# Patient Record
Sex: Female | Born: 1975 | Race: Black or African American | Hispanic: No | Marital: Married | Smoking: Never smoker
Health system: Southern US, Community
[De-identification: ages and names within clinical notes are randomized; demographics above are authoritative.]

## PROBLEM LIST (undated history)

## (undated) DIAGNOSIS — E785 Hyperlipidemia, unspecified: Secondary | ICD-10-CM

## (undated) DIAGNOSIS — D649 Anemia, unspecified: Secondary | ICD-10-CM

## (undated) DIAGNOSIS — I639 Cerebral infarction, unspecified: Secondary | ICD-10-CM

## (undated) DIAGNOSIS — I10 Essential (primary) hypertension: Secondary | ICD-10-CM

## (undated) DIAGNOSIS — R011 Cardiac murmur, unspecified: Secondary | ICD-10-CM

## (undated) DIAGNOSIS — E669 Obesity, unspecified: Secondary | ICD-10-CM

## (undated) DIAGNOSIS — K219 Gastro-esophageal reflux disease without esophagitis: Secondary | ICD-10-CM

## (undated) DIAGNOSIS — E079 Disorder of thyroid, unspecified: Secondary | ICD-10-CM

## (undated) DIAGNOSIS — E039 Hypothyroidism, unspecified: Secondary | ICD-10-CM

## (undated) DIAGNOSIS — R739 Hyperglycemia, unspecified: Secondary | ICD-10-CM

## (undated) DIAGNOSIS — N200 Calculus of kidney: Secondary | ICD-10-CM

## (undated) HISTORY — DX: Hyperlipidemia, unspecified: E78.5

## (undated) HISTORY — DX: Hyperglycemia, unspecified: R73.9

## (undated) HISTORY — PX: SLEEVE GASTROPLASTY: SHX1101

## (undated) HISTORY — DX: Cerebral infarction, unspecified: I63.9

## (undated) HISTORY — PX: PARTIAL HYSTERECTOMY: SHX80

## (undated) HISTORY — DX: Disorder of thyroid, unspecified: E07.9

## (undated) HISTORY — DX: Anemia, unspecified: D64.9

## (undated) HISTORY — DX: Obesity, unspecified: E66.9

## (undated) HISTORY — DX: Calculus of kidney: N20.0

## (undated) HISTORY — DX: Hypothyroidism, unspecified: E03.9

## (undated) HISTORY — DX: Essential (primary) hypertension: I10

## (undated) HISTORY — DX: Cardiac murmur, unspecified: R01.1

## (undated) HISTORY — PX: COLONOSCOPY: SHX174

## (undated) HISTORY — DX: Gastro-esophageal reflux disease without esophagitis: K21.9

---

## 1999-09-27 ENCOUNTER — Emergency Department (HOSPITAL_COMMUNITY): Admission: EM | Admit: 1999-09-27 | Discharge: 1999-09-27 | Payer: Self-pay | Admitting: Emergency Medicine

## 2003-08-04 ENCOUNTER — Emergency Department (HOSPITAL_COMMUNITY): Admission: EM | Admit: 2003-08-04 | Discharge: 2003-08-04 | Payer: Self-pay | Admitting: Emergency Medicine

## 2004-11-10 ENCOUNTER — Emergency Department: Payer: Self-pay | Admitting: Emergency Medicine

## 2005-01-15 ENCOUNTER — Emergency Department: Payer: Self-pay | Admitting: Emergency Medicine

## 2005-07-06 ENCOUNTER — Ambulatory Visit: Payer: Self-pay | Admitting: Unknown Physician Specialty

## 2005-07-08 ENCOUNTER — Observation Stay: Payer: Self-pay | Admitting: Unknown Physician Specialty

## 2005-08-15 ENCOUNTER — Other Ambulatory Visit: Payer: Self-pay

## 2005-08-15 ENCOUNTER — Emergency Department: Payer: Self-pay | Admitting: Emergency Medicine

## 2006-01-08 ENCOUNTER — Emergency Department: Payer: Self-pay | Admitting: Emergency Medicine

## 2006-01-08 ENCOUNTER — Other Ambulatory Visit: Payer: Self-pay

## 2006-09-29 ENCOUNTER — Emergency Department: Payer: Self-pay | Admitting: Emergency Medicine

## 2007-01-15 ENCOUNTER — Emergency Department (HOSPITAL_COMMUNITY): Admission: EM | Admit: 2007-01-15 | Discharge: 2007-01-15 | Payer: Self-pay | Admitting: Emergency Medicine

## 2007-09-30 ENCOUNTER — Emergency Department: Payer: Self-pay | Admitting: Emergency Medicine

## 2007-09-30 ENCOUNTER — Other Ambulatory Visit: Payer: Self-pay

## 2008-02-13 ENCOUNTER — Emergency Department: Payer: Self-pay | Admitting: Emergency Medicine

## 2008-10-01 ENCOUNTER — Emergency Department: Payer: Self-pay | Admitting: Emergency Medicine

## 2008-12-02 ENCOUNTER — Emergency Department: Payer: Self-pay | Admitting: Emergency Medicine

## 2009-11-22 ENCOUNTER — Emergency Department: Payer: Self-pay

## 2011-07-28 ENCOUNTER — Emergency Department: Payer: Self-pay | Admitting: Emergency Medicine

## 2011-07-28 LAB — CBC
HCT: 38.6 % (ref 35.0–47.0)
MCHC: 33 g/dL (ref 32.0–36.0)
MCV: 82 fL (ref 80–100)
Platelet: 240 10*3/uL (ref 150–440)
RDW: 13.6 % (ref 11.5–14.5)

## 2011-07-28 LAB — COMPREHENSIVE METABOLIC PANEL
Albumin: 3.8 g/dL (ref 3.4–5.0)
Alkaline Phosphatase: 55 U/L (ref 50–136)
BUN: 8 mg/dL (ref 7–18)
Bilirubin,Total: 0.5 mg/dL (ref 0.2–1.0)
Chloride: 106 mmol/L (ref 98–107)
Creatinine: 0.94 mg/dL (ref 0.60–1.30)
Potassium: 4 mmol/L (ref 3.5–5.1)
SGOT(AST): 20 U/L (ref 15–37)
SGPT (ALT): 35 U/L
Total Protein: 7.4 g/dL (ref 6.4–8.2)

## 2012-05-17 ENCOUNTER — Observation Stay: Payer: Self-pay | Admitting: Family Medicine

## 2012-05-17 LAB — HEMOGLOBIN A1C: Hemoglobin A1C: 5.9 % (ref 4.2–6.3)

## 2012-05-17 LAB — BASIC METABOLIC PANEL
Anion Gap: 4 — ABNORMAL LOW (ref 7–16)
BUN: 8 mg/dL (ref 7–18)
Chloride: 107 mmol/L (ref 98–107)
Creatinine: 1.01 mg/dL (ref 0.60–1.30)
EGFR (Non-African Amer.): >60
Potassium: 4.4 mmol/L (ref 3.5–5.1)

## 2012-05-17 LAB — CBC
MCH: 26.5 pg (ref 26.0–34.0)
MCV: 81 fL (ref 80–100)
Platelet: 221 10*3/uL (ref 150–440)
RBC: 5.03 10*6/uL (ref 3.80–5.20)

## 2012-05-17 LAB — PROTIME-INR: INR: 0.9

## 2012-05-17 LAB — TROPONIN I
Troponin-I: <0.02 ng/mL
Troponin-I: <0.02 ng/mL

## 2012-05-17 LAB — LIPID PANEL: HDL Cholesterol: 45 mg/dL (ref 40–60)

## 2012-05-17 LAB — TSH: Thyroid Stimulating Horm: 0.54 u[IU]/mL

## 2012-05-17 LAB — CK TOTAL AND CKMB (NOT AT ARMC): CK, Total: 98 U/L (ref 21–215)

## 2012-05-18 LAB — BASIC METABOLIC PANEL
Anion Gap: 6 — ABNORMAL LOW (ref 7–16)
BUN: 12 mg/dL (ref 7–18)
Calcium, Total: 8.3 mg/dL — ABNORMAL LOW (ref 8.5–10.1)
Chloride: 104 mmol/L (ref 98–107)
Co2: 28 mmol/L (ref 21–32)
Osmolality: 276 (ref 275–301)
Potassium: 4.5 mmol/L (ref 3.5–5.1)

## 2012-05-18 LAB — CBC WITH DIFFERENTIAL/PLATELET
Basophil #: 0 10*3/uL (ref 0.0–0.1)
Lymphocyte #: 2.3 10*3/uL (ref 1.0–3.6)
MCH: 26.6 pg (ref 26.0–34.0)
MCHC: 32.5 g/dL (ref 32.0–36.0)
MCV: 82 fL (ref 80–100)
Monocyte #: 0.9 x10 3/mm (ref 0.2–0.9)
Platelet: 201 10*3/uL (ref 150–440)
RDW: 13.8 % (ref 11.5–14.5)

## 2012-10-23 ENCOUNTER — Ambulatory Visit: Payer: Self-pay | Admitting: Internal Medicine

## 2013-05-21 ENCOUNTER — Emergency Department: Payer: Self-pay | Admitting: Emergency Medicine

## 2014-01-30 ENCOUNTER — Ambulatory Visit: Payer: Self-pay | Admitting: Surgery

## 2014-02-06 ENCOUNTER — Ambulatory Visit: Payer: Self-pay | Admitting: Surgery

## 2014-02-06 LAB — CBC WITH DIFFERENTIAL/PLATELET
BASOS ABS: 0 10*3/uL (ref 0.0–0.1)
Basophil %: 0.5 %
EOS ABS: 0.2 10*3/uL (ref 0.0–0.7)
EOS PCT: 2.3 %
HCT: 40.1 % (ref 35.0–47.0)
HGB: 12.9 g/dL (ref 12.0–16.0)
LYMPHS ABS: 2.3 10*3/uL (ref 1.0–3.6)
Lymphocyte %: 27.5 %
MCH: 26.6 pg (ref 26.0–34.0)
MCHC: 32.1 g/dL (ref 32.0–36.0)
MCV: 83 fL (ref 80–100)
MONOS PCT: 9.4 %
Monocyte #: 0.8 x10 3/mm (ref 0.2–0.9)
NEUTROS ABS: 4.9 10*3/uL (ref 1.4–6.5)
Neutrophil %: 60.3 %
Platelet: 216 10*3/uL (ref 150–440)
RBC: 4.83 10*6/uL (ref 3.80–5.20)
RDW: 13.8 % (ref 11.5–14.5)
WBC: 8.2 10*3/uL (ref 3.6–11.0)

## 2014-02-06 LAB — PHOSPHORUS: Phosphorus: 3.3 mg/dL (ref 2.5–4.9)

## 2014-02-06 LAB — COMPREHENSIVE METABOLIC PANEL
ALK PHOS: 57 U/L
ALT: 36 U/L
ANION GAP: 10 (ref 7–16)
Albumin: 3.7 g/dL (ref 3.4–5.0)
BUN: 8 mg/dL (ref 7–18)
Bilirubin,Total: 0.5 mg/dL (ref 0.2–1.0)
CALCIUM: 8.4 mg/dL — AB (ref 8.5–10.1)
CO2: 23 mmol/L (ref 21–32)
Chloride: 106 mmol/L (ref 98–107)
Creatinine: 1.04 mg/dL (ref 0.60–1.30)
EGFR (African American): >60
Glucose: 106 mg/dL — ABNORMAL HIGH (ref 65–99)
Osmolality: 276 (ref 275–301)
Potassium: 3.8 mmol/L (ref 3.5–5.1)
SGOT(AST): 17 U/L (ref 15–37)
Sodium: 139 mmol/L (ref 136–145)
Total Protein: 7 g/dL (ref 6.4–8.2)

## 2014-02-06 LAB — IRON AND TIBC
Iron Bind.Cap.(Total): 353 ug/dL (ref 250–450)
Iron Saturation: 13 %
Iron: 45 ug/dL — ABNORMAL LOW (ref 50–170)
Unbound Iron-Bind.Cap.: 308 ug/dL

## 2014-02-06 LAB — FOLATE: Folic Acid: 17.1 ng/mL (ref 3.1–100.0)

## 2014-02-06 LAB — MAGNESIUM: MAGNESIUM: 2 mg/dL

## 2014-02-06 LAB — PROTIME-INR
INR: 0.9
PROTHROMBIN TIME: 11.9 s (ref 11.5–14.7)

## 2014-02-06 LAB — APTT: Activated PTT: 33.2 secs (ref 23.6–35.9)

## 2014-02-06 LAB — TSH: Thyroid Stimulating Horm: 0.599 u[IU]/mL

## 2014-02-06 LAB — AMYLASE: Amylase: 29 U/L (ref 25–115)

## 2014-02-06 LAB — FERRITIN: Ferritin (ARMC): 26 ng/mL (ref 8–388)

## 2014-02-06 LAB — HEMOGLOBIN A1C: Hemoglobin A1C: 5.7 % (ref 4.2–6.3)

## 2014-02-10 ENCOUNTER — Encounter (INDEPENDENT_AMBULATORY_CARE_PROVIDER_SITE_OTHER): Payer: Self-pay

## 2014-02-10 ENCOUNTER — Encounter: Payer: Self-pay | Admitting: Cardiovascular Disease

## 2014-02-10 ENCOUNTER — Ambulatory Visit (INDEPENDENT_AMBULATORY_CARE_PROVIDER_SITE_OTHER): Payer: BC Managed Care – PPO | Admitting: Cardiovascular Disease

## 2014-02-10 VITALS — BP 122/90 | HR 69 | Ht 65.0 in | Wt 255.2 lb

## 2014-02-10 DIAGNOSIS — R0789 Other chest pain: Secondary | ICD-10-CM | POA: Insufficient documentation

## 2014-02-10 DIAGNOSIS — Z01810 Encounter for preprocedural cardiovascular examination: Secondary | ICD-10-CM

## 2014-02-10 NOTE — Progress Notes (Signed)
     [name] Date of Birth  1976/01/28       [facility]    [employer] [address], [address], [place], [place]  [address]   [place], [place]  [address] [place]   Fax  [phone]     Fax [phone]  Problem List: 1. Atypical chest pain - has had 2 negative stress tests.  2. Morbid obesity   History of Present Illness:  Diana is a 38 yo with hx of morbid obesity .  She is here for pre-op evaluation prior to having a gastric sleeve procedure.    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    She's had 2 separate stress test both of which are negative. Her for a stress test was in 2013. She had normal left ventricle systolic function with an ejection fraction of 67%. There was no evidence of ischemia. She also had a stress test with Dr. Rosario Jacks.  Which was reportedly normal.   I do not have those results.      No current outpatient prescriptions on file prior to visit.   No current facility-administered medications on file prior to visit.    No Known Allergies  Past Medical History  Diagnosis Date  . Hyperlipidemia   . Hypertension   . Hyperglycemia   . Hypothyroidism   . Thyroid mass   . Obesity     Past Surgical History  Procedure Laterality Date  . Partial hysterectomy    . Cesarean section      History  Smoking status  . Never Smoker   Smokeless tobacco  . Not on file    History  Alcohol Use  . Yes    Comment: occas wine.    Family History  Problem Relation Age of Onset  . Hypertension Mother   . Hyperlipidemia Mother   . Hypertension Father   . Hyperlipidemia Father   . Heart attack Paternal Aunt 61  . Heart attack Paternal Aunt 64    Reviw of Systems:  Reviewed in the HPI.  All other systems are  negative.  Physical Exam: Blood pressure 122/90, pulse 69, height 5\' 5"  (1.651 m), weight 255 lb 4 oz (115.781 kg). Wt Readings from Last 3 Encounters:  02/10/14 255 lb 4 oz (115.781 kg)    General: Well developed, well nourished, in no acute distress.  Head: Normocephalic, atraumatic, sclera non-icteric, mucus membranes are moist,   Neck: Supple. Carotids are 2 + without bruits. No JVD   Lungs: Clear   Heart: RR, normal S1S2, no murmur  Abdomen: Soft, non-tender, non-distended with normal bowel sounds.  Msk:  Strength and tone are normal   Extremities: No clubbing or cyanosis. No edema.  Distal pedal pulses are 2+ and equal    Neuro: CN II - XII intact.  Alert and oriented X 3.   Psych:  Normal   ECG: Sept. 1, 2015: Normal sinus rhythm at 69. She has no ST or T wave changes.  Assessment / Plan:

## 2014-02-10 NOTE — Assessment & Plan Note (Addendum)
[place]  presents for further evaluation of some atypical chest pain. She's had chest pains for the past several years and has had 2 separate nuclear stress test which up in negative. She now scheduled to have bariatric surgery and these have cardiac clearance.  The patient has some atypical left arm discomfort does not sound anginal. She's able to participate in fairly rigorous activities for an hour at a time and does not having episodes of chest pain or shortness breath.  Her EKG is normal.  She had a normal stress test approximately 6 months ago. In my opinion I think she is at low risk for upcoming gastric sleeve procedure. I will see her on an as needed basis.    Copy of report to  [facility] [address] [place], [place]  [address] [email or web address] Fax:  [phone]

## 2014-02-10 NOTE — Patient Instructions (Signed)
Call or return to clinic prn if these symptoms worsen or fail to improve as anticipated.

## 2014-04-07 ENCOUNTER — Emergency Department: Payer: Self-pay | Admitting: Emergency Medicine

## 2014-04-07 LAB — CBC
HCT: 41.8 % (ref 35.0–47.0)
HGB: 13 g/dL (ref 12.0–16.0)
MCH: 25.9 pg — ABNORMAL LOW (ref 26.0–34.0)
MCHC: 31.2 g/dL — AB (ref 32.0–36.0)
MCV: 83 fL (ref 80–100)
PLATELETS: 182 10*3/uL (ref 150–440)
RBC: 5.03 10*6/uL (ref 3.80–5.20)
RDW: 14.5 % (ref 11.5–14.5)
WBC: 5.7 10*3/uL (ref 3.6–11.0)

## 2014-04-07 LAB — D-DIMER(ARMC): D-DIMER: 231 ng/mL

## 2014-04-07 LAB — BASIC METABOLIC PANEL
Anion Gap: 8 (ref 7–16)
BUN: 9 mg/dL (ref 7–18)
CALCIUM: 8.7 mg/dL (ref 8.5–10.1)
Chloride: 105 mmol/L (ref 98–107)
Co2: 28 mmol/L (ref 21–32)
Creatinine: 0.99 mg/dL (ref 0.60–1.30)
EGFR (Non-African Amer.): >60
Glucose: 97 mg/dL (ref 65–99)
OSMOLALITY: 280 (ref 275–301)
Potassium: 3.4 mmol/L — ABNORMAL LOW (ref 3.5–5.1)
Sodium: 141 mmol/L (ref 136–145)

## 2014-04-07 LAB — TROPONIN I

## 2014-09-29 NOTE — Discharge Summary (Signed)
PATIENT NAME:  [name], Diana D MR#:  [id number] DATE OF BIRTH:  16-Nov-1975  DATE OF ADMISSION:  05/17/2012 DATE OF DISCHARGE:  05/18/2012  REASON FOR ADMISSION:  Chest pain.  DISCHARGE DIAGNOSES:  1. Pleuritic chest pain.  2. Hyperlipidemia.  3. Hypertension.  4. Hyperglycemia with normal hemoglobin A1c.  5. Hypothyroidism.  6. History of thyroid mass.  7. Obesity.  8. Possible obstructive sleep apnea.   IMPORTANT TESTS:  Full stress test results to follow. Negative, but no full report at this moment.   LABORATORY DATA: Within normal limits with a glucose of 113, normal creatinine and normal electrolytes. Troponins are negative times three. TSH 0.5, hemoglobin 12, white count 7.9. Chest x-ray within normal limits.  Coagulation within normal limits. Normal sinus rhythm on EKG.   HOSPITAL COURSE: [name] is a very nice 39 year old female who has history of hypertension as mentioned above, hyperlipidemia, and borderline diabetes. Apparently she has a history of two weeks of having occasional chest pain for which she has seen her primary care physician.  Her primary care physician has ordered a stress test that she had scheduled for 12/18. Unfortunately, she had significant chest pain the morning of admission on 05/17/2012 at 8:30 at work, which she was concerned was indigestion. The pain got worse. The patient started sweating, having shortness of breath, and significant anxiety and mild agitation for which she drove to the ER, but she had to stop in the middle of the road with worsening of the pain.   On evaluation in the ER the patient did not have any ST depression or elevation. No changes of her oxygen saturation. No significant shortness of breath. She was admitted for observation and a stress test was done. Cardiac enzymes were negative.  The patient did very well. She had occasional GI upset for which I recommended she follow up with her primary care physician to get an EGD. At  this moment I am not going to prescribe a PPI so that we do not mask any of the symptoms.    Her blood pressure was well controlled and I recommended to continue taking lisinopril.   Thyroid disease- the patient has hypothyroidism and takes thyroid replacement. Her TSH is normal low. She had an ultrasound that apparently had a nodule and she will follow up with her primary care physician for this as well. Her borderline diabetes is fine. Her hemoglobin A1c is less than 6.  Hyperlipidemia- her lipid profile looks really good. She does have symptoms of obstructive sleep apnea for which I also recommended follow-up with an outpatient sleep study with her primary care physician.   TIME SPENT: I spent about 40 minutes with this discharge today.   ____________________________ [place] Sink, MD rsg:bjt D: 05/18/2012 15:46:26 ET T: 05/19/2012 11:56:03 ET JOB#: [id number]  cc: Glenwood Sink, MD, <Dictator> Donasia Wimes America Brown MD ELECTRONICALLY SIGNED 05/26/2012 14:05

## 2014-09-29 NOTE — H&P (Signed)
PATIENT NAME:  RICHMOND, [place] D MR#:  [id number] DATE OF BIRTH:  [birth year]  DATE OF ADMISSION:  05/17/2012  REASON FOR ADMISSION: Acute chest pain.   PRIMARY CARE PHYSICIAN: Elisabeth Pigeon, MD   REFERRING PHYSICIAN: Coralyn Pear, MD   HISTORY OF PRESENT ILLNESS: Ms. [name] is a very nice 39 year old female who has history of hyperlipidemia, hypertension, borderline diabetes, hypothyroidism with a thyroid mass who is obese and had a history of chest pain that started two weeks ago. The chest pain apparently comes and goes, is not everyday but when it comes it feels like a twist of some structure inside her chest and it lasts occasionally for minutes, occasionally for 30 minutes and then it goes away. It is not related to foods. It is not related to activity. It comes and goes at its own pace. Apparently this morning around 8:30 in the morning she went to work. At that moment she was already feeling some type of pain on the right side of the chest for what she was concerned about being indigestion. She went to work and the pain started getting worse to a certain point. She started having severe increase of the pain. It felt like a twitch inside and then something was stabbing her chest. She had a lot of pressure and she started getting really short of breath. Her right arm became numb and she started having intense sweating. After a while it started to calm down and she decided to go to the ER. Once she was in the car, she started having the same symptoms again and she had to stop and call EMS. The patient was brought to the ER. In the ER, she was evaluated and cardiac work-up so far is negative but due to her significant risk factors and intensity of the pain she is going to be admitted for observation and get a stress test in the morning. She already had a stress test scheduled for December 18th due to the progressive symptoms of chest pain.   REVIEW OF SYSTEMS: Denies any fever, fatigue, weakness,  weight loss or significant weight gain. EYES: Denies any blurry vision, glaucoma, cataracts. ENT: Denies any tinnitus, nasal discharge, postnasal drip. RESPIRATORY: No cough. No wheezing, no hemoptysis. CARDIOVASCULAR: Positive chest pain as mentioned above. No orthopnea, no edema, no arrhythmias, no palpitations. GI: No nausea, vomiting, diarrhea. No rectal bleeding. No constipation. GU: No dysuria or hematuria. GYN: No breast masses. No vaginal bleeding. ENDOCRINE: No polyuria, polydipsia, or polyphagia. Her glucose has been mildly elevated. She has hypothyroidism. No significant symptoms at this moment although she has a thyroid mass that has been evaluated by her primary care physician and she will have some work-up after her pain goes away. SKIN: No significant rashes or mole changes. HEME/LYMPH: No anemia. No easy bruising or bleeding or swollen glands. NEUROLOGICAL: No numbness, tingling. No TIA or CVA. PSYCHIATRIC: No anxiety, insomnia. The patient denies any depression.   PAST MEDICAL HISTORY:  1. Hyperlipidemia.  2. Hypertension.  3. Hyperglycemia. 4. Borderline diabetes.  5. Hypothyroidism.  6. Thyroid mass.  7. Obesity.  8. Possible obstructive sleep apnea, undiagnosed.   ALLERGIES: No known drug allergies.   PAST SURGICAL HISTORY:  1. Two Cesarean sections. 2. Partial hysterectomy.   FAMILY HISTORY: Multiple family members with sudden death due to coronary artery disease, heart attacks at the age of 89's and 84's. Two aunts recently died from sudden MI at the age of 35 and her uncle also had a massive  heart attack at the age of 30.   MEDICATIONS:  1. Crestor 5 mg once a day. 2. Lisinopril 10 mg once a day. 3. Synthroid 50 mcg once daily.   SOCIAL HISTORY: She does not smoke. She drinks occasionally wine twice a month. No IV drug abuse. She lives with her son and daughter. She is a single mother but she has a boyfriend. She works as a Librarian, academic at a [employer] and she states  that her job is not stressful at all.   PHYSICAL EXAMINATION:   VITAL SIGNS: Blood pressure of 132/88, pulse 72, respiratory rate 18, temperature 98.3.   GENERAL: The patient is alert, oriented x3, in no acute distress. No respiratory distress. Hemodynamically stable.   HEENT: Her pupils are equal and reactive. Extraocular movements are intact. Mucosa moist.   NECK: Supple. No JVD. No thyromegaly. No adenopathy. No carotid bruits. Normal range of motion.   CARDIOVASCULAR: Regular rate and rhythm. No murmurs, rubs, or gallops are appreciated at this moment. No displacement of PMI. Chest wall is mildly tender to palpation at the right side of the chest on top of the middle upper quadrant of the breast but the pain seems to be different than the one that the patient was having earlier.   LUNGS: Clear without any wheezing or crepitus. No use of accessory muscles. No dullness to percussion.   ABDOMEN: Soft, nontender, not distended. No hepatosplenomegaly. No masses. Bowel sounds are positive.   EXTREMITIES: No edema, no cyanosis, no clubbing. No Homans. Pulses +2. Capillary refill less than 3.   NEUROLOGIC: Cranial nerves II through XII are intact.   PSYCH: Mood is normal without any signs of acute anxiety or major depression.   LYMPHATIC: Negative for lymphadenopathy in neck or supraclavicular area.   SKIN: No rashes or petechiae.   MUSCULOSKELETAL: No significant joint abnormalities. No erythema on top of the joints.   LABORATORY, DIAGNOSTIC, AND RADIOLOGICAL DATA: Glucose 108. Electrolytes within normal limits with a creatinine of 1.01. Troponin is negative. White blood count 7.5, hemoglobin 13.3. INR 0.9.   EKG no ST elevation, normal sinus rhythm.   ASSESSMENT AND PLAN: This is a very nice 39 year old female who has history of hyperlipidemia, hypertension, borderline diabetes, obesity, hypothyroidism, and a recently discovered thyroid mass who comes here for chest pain.  1. Chest  pain, atypical, but the patient is with multiple risk factors. She does have atypical and typical features. Most of the episode today was very severe and it seems very typical for coronary artery disease except for the location of the pain, located a little bit to the right parasternal area. She has factors of family history, high cholesterol, high blood sugar, and high blood pressure. At this moment we are going to admit her on observation. Do cardiac enzymes and get a Myoview scan in the morning. I don't think she needs an echo at this moment but will consider getting one done depending on the results of the Myoview. The patient is going to be put on aspirin 325 mg a day and since she is ambulatory I think she can just use SCDs. I don't think she needs to be fully anticoagulated. I think the patient exhibits a lot of symptoms that might be related to gastrointestinal disease for which she might benefit from starting a better diet and starting a PPI. She might benefit from getting an outpatient GI appointment for possible EGD.  2. Hypertension, well controlled. Continue treatment with lisinopril. Her heart rate  is in the 60's for what I am not going to add on a beta-blocker.  3. Thyroid disease, hypothyroidism. Check TSH. The patient also has a history of thyroid mass which is being evaluated by her primary care physician. She already had an ultrasound that apparently was okay. I do not have the results and by okay I don't know it it's a nodule or something else. I do not feel a mass when I palpate her thyroid today. Will let her follow-up with her primary care physician for this issue.  4. Borderline diabetes. Add on a hemoglobin A1c today. The patient is not on medication.  5. Hyperlipidemia. Continue treatment with Crestor.  6. Possible obstructive sleep apnea. The patient is obese. She says that she snores a lot and occasionally has apneic episode and wakes up in the middle of the night on multiple  occasions. I will recommend her to do an outpatient sleep study.  7. DVT prophylaxis. SCDs and ambulation.  8. GI prophylaxis. PPI.   TIME SPENT: I spent about 40 minutes with this admission.   ____________________________ [place] Sink, MD rsg:drc D: 05/17/2012 13:14:02 ET T: 05/17/2012 14:19:04 ET JOB#: [id number]  cc: [place] Sink, MD, <Dictator> Tien Aispuro America Brown MD ELECTRONICALLY SIGNED 05/21/2012 7:25

## 2014-12-10 ENCOUNTER — Other Ambulatory Visit: Payer: Self-pay

## 2014-12-10 ENCOUNTER — Emergency Department
Admission: EM | Admit: 2014-12-10 | Discharge: 2014-12-11 | Discharge status: Against Medical Advice | Payer: Commercial Managed Care - HMO | Source: Home / Self Care | Attending: Emergency Medicine | Admitting: Emergency Medicine

## 2014-12-10 DIAGNOSIS — R739 Hyperglycemia, unspecified: Secondary | ICD-10-CM | POA: Diagnosis present

## 2014-12-10 DIAGNOSIS — R0789 Other chest pain: Secondary | ICD-10-CM | POA: Insufficient documentation

## 2014-12-10 DIAGNOSIS — E785 Hyperlipidemia, unspecified: Secondary | ICD-10-CM | POA: Diagnosis present

## 2014-12-10 DIAGNOSIS — Z6825 Body mass index (BMI) 25.0-25.9, adult: Secondary | ICD-10-CM

## 2014-12-10 DIAGNOSIS — K8050 Calculus of bile duct without cholangitis or cholecystitis without obstruction: Secondary | ICD-10-CM | POA: Diagnosis not present

## 2014-12-10 DIAGNOSIS — E669 Obesity, unspecified: Secondary | ICD-10-CM | POA: Diagnosis present

## 2014-12-10 DIAGNOSIS — N390 Urinary tract infection, site not specified: Secondary | ICD-10-CM | POA: Diagnosis present

## 2014-12-10 DIAGNOSIS — I10 Essential (primary) hypertension: Secondary | ICD-10-CM

## 2014-12-10 DIAGNOSIS — K8001 Calculus of gallbladder with acute cholecystitis with obstruction: Secondary | ICD-10-CM | POA: Diagnosis present

## 2014-12-10 DIAGNOSIS — Z9884 Bariatric surgery status: Secondary | ICD-10-CM

## 2014-12-10 DIAGNOSIS — E039 Hypothyroidism, unspecified: Secondary | ICD-10-CM | POA: Diagnosis present

## 2014-12-10 DIAGNOSIS — E049 Nontoxic goiter, unspecified: Secondary | ICD-10-CM | POA: Diagnosis present

## 2014-12-10 MED ORDER — GI COCKTAIL ~~LOC~~
30.0000 mL | Freq: Once | ORAL | Status: AC
  Administered 2014-12-10: 30 mL via ORAL

## 2014-12-10 NOTE — ED Notes (Signed)
Pt in with co chest pain tonight, no cardiac history.  Had gastric sleeve last year.

## 2014-12-11 ENCOUNTER — Emergency Department: Payer: Commercial Managed Care - HMO

## 2014-12-11 ENCOUNTER — Other Ambulatory Visit: Payer: Self-pay

## 2014-12-11 ENCOUNTER — Encounter: Payer: Self-pay | Admitting: Emergency Medicine

## 2014-12-11 ENCOUNTER — Inpatient Hospital Stay
Admission: EM | Admit: 2014-12-11 | Discharge: 2014-12-12 | DRG: 445 | Payer: Commercial Managed Care - HMO | Attending: Internal Medicine | Admitting: Internal Medicine

## 2014-12-11 DIAGNOSIS — K8050 Calculus of bile duct without cholangitis or cholecystitis without obstruction: Secondary | ICD-10-CM | POA: Diagnosis present

## 2014-12-11 DIAGNOSIS — K8001 Calculus of gallbladder with acute cholecystitis with obstruction: Secondary | ICD-10-CM

## 2014-12-11 DIAGNOSIS — R1011 Right upper quadrant pain: Secondary | ICD-10-CM

## 2014-12-11 LAB — CBC
HEMATOCRIT: 37.3 % (ref 35.0–47.0)
HEMOGLOBIN: 11.8 g/dL — AB (ref 12.0–16.0)
MCH: 26.3 pg (ref 26.0–34.0)
MCHC: 31.8 g/dL — ABNORMAL LOW (ref 32.0–36.0)
MCV: 82.8 fL (ref 80.0–100.0)
PLATELETS: 229 10*3/uL (ref 150–440)
RBC: 4.5 MIL/uL (ref 3.80–5.20)
RDW: 13.1 % (ref 11.5–14.5)
WBC: 11.5 10*3/uL — AB (ref 3.6–11.0)

## 2014-12-11 LAB — URINALYSIS COMPLETE WITH MICROSCOPIC (ARMC ONLY)
Glucose, UA: NEGATIVE mg/dL
Hgb urine dipstick: NEGATIVE
NITRITE: POSITIVE — AB
PROTEIN: 30 mg/dL — AB
Specific Gravity, Urine: 1.023 (ref 1.005–1.030)
pH: 5 (ref 5.0–8.0)

## 2014-12-11 LAB — COMPREHENSIVE METABOLIC PANEL
ALBUMIN: 3.9 g/dL (ref 3.5–5.0)
ALK PHOS: 46 U/L (ref 38–126)
ALK PHOS: 89 U/L (ref 38–126)
ALT: 59 U/L — AB (ref 14–54)
ALT: 518 U/L — ABNORMAL HIGH (ref 14–54)
ANION GAP: 9 (ref 5–15)
AST: 102 U/L — ABNORMAL HIGH (ref 15–41)
AST: 489 U/L — ABNORMAL HIGH (ref 15–41)
Albumin: 3.8 g/dL (ref 3.5–5.0)
Anion gap: 8 (ref 5–15)
BUN: 11 mg/dL (ref 6–20)
BUN: 7 mg/dL (ref 6–20)
CO2: 26 mmol/L (ref 22–32)
CO2: 26 mmol/L (ref 22–32)
Calcium: 8.8 mg/dL — ABNORMAL LOW (ref 8.9–10.3)
Calcium: 9 mg/dL (ref 8.9–10.3)
Chloride: 107 mmol/L (ref 101–111)
Chloride: 107 mmol/L (ref 101–111)
Creatinine, Ser: 0.88 mg/dL (ref 0.44–1.00)
Creatinine, Ser: 0.69 mg/dL (ref 0.44–1.00)
GFR calc Af Amer: >60 mL/min (ref >60)
GLUCOSE: 120 mg/dL — AB (ref 65–99)
Glucose, Bld: 104 mg/dL — ABNORMAL HIGH (ref 65–99)
POTASSIUM: 3.2 mmol/L — AB (ref 3.5–5.1)
POTASSIUM: 4.2 mmol/L (ref 3.5–5.1)
Sodium: 142 mmol/L (ref 135–145)
Sodium: 141 mmol/L (ref 135–145)
TOTAL PROTEIN: 6.5 g/dL (ref 6.5–8.1)
Total Bilirubin: 0.5 mg/dL (ref 0.3–1.2)
Total Bilirubin: 3 mg/dL — ABNORMAL HIGH (ref 0.3–1.2)
Total Protein: 6.8 g/dL (ref 6.5–8.1)

## 2014-12-11 LAB — CBC WITH DIFFERENTIAL/PLATELET
BASOS ABS: 0.1 10*3/uL (ref 0–0.1)
Basophils Relative: 2 %
Eosinophils Absolute: 0 10*3/uL (ref 0–0.7)
Eosinophils Relative: 1 %
HCT: 37.2 % (ref 35.0–47.0)
HEMOGLOBIN: 11.8 g/dL — AB (ref 12.0–16.0)
Lymphocytes Relative: 11 %
Lymphs Abs: 0.8 10*3/uL — ABNORMAL LOW (ref 1.0–3.6)
MCH: 26.3 pg (ref 26.0–34.0)
MCHC: 31.8 g/dL — AB (ref 32.0–36.0)
MCV: 82.7 fL (ref 80.0–100.0)
Monocytes Absolute: 0.5 10*3/uL (ref 0.2–0.9)
Monocytes Relative: 6 %
NEUTROS ABS: 6.4 10*3/uL (ref 1.4–6.5)
Neutrophils Relative %: 80 %
Platelets: 236 10*3/uL (ref 150–440)
RBC: 4.5 MIL/uL (ref 3.80–5.20)
RDW: 13.1 % (ref 11.5–14.5)
WBC: 7.9 10*3/uL (ref 3.6–11.0)

## 2014-12-11 LAB — CK: CK TOTAL: 56 U/L (ref 38–234)

## 2014-12-11 LAB — TROPONIN I

## 2014-12-11 MED ORDER — ONDANSETRON 8 MG PO TBDP
8.0000 mg | ORAL_TABLET | Freq: Once | ORAL | Status: AC
  Administered 2014-12-11: 8 mg via ORAL

## 2014-12-11 MED ORDER — GI COCKTAIL ~~LOC~~
ORAL | Status: AC
  Filled 2014-12-11: qty 30

## 2014-12-11 MED ORDER — ONDANSETRON HCL 4 MG/2ML IJ SOLN
INTRAMUSCULAR | Status: AC
  Filled 2014-12-11: qty 4

## 2014-12-11 MED ORDER — ONDANSETRON 8 MG PO TBDP
ORAL_TABLET | ORAL | Status: AC
  Filled 2014-12-11: qty 1

## 2014-12-11 MED ORDER — SODIUM CHLORIDE 0.9 % IV SOLN: 8.0000 mg | Freq: Once | INTRAVENOUS | Status: DC

## 2014-12-11 MED ORDER — GI COCKTAIL ~~LOC~~
ORAL | Status: AC
  Administered 2014-12-10: 30 mL via ORAL
  Filled 2014-12-11: qty 30

## 2014-12-11 NOTE — ED Notes (Signed)
Pt refuses to stay at this medical facility for further treatment. Pt has received information on risks of leaving medical facility before treatment received. Pt understand and continues to want to leave ER medical facility AMA.

## 2014-12-11 NOTE — ED Provider Notes (Signed)
[facility] Emergency Department Provider Note  ____________________________________________  Time seen:   I have reviewed the triage vital signs and the nursing notes.   HISTORY  Chief Complaint Chest Pain      HPI [place] Danyell Trinna Balloon is a 39 y.o. female presents with midline abdominal pain radiating into the chest consistent with previous episodes of gastroesophageal reflux. Patient states that she was prescribed medication for such concern gastric sleeve but has not taking it because she did not need it. Today patient states that she had a carbonated beverage and since that has had recurrent pain that she is experiencing.     Past Medical History  Diagnosis Date  . Hyperlipidemia   . Hypertension   . Hyperglycemia   . Hypothyroidism   . Thyroid mass   . Obesity     Patient Active Problem List   Diagnosis Date Noted  . Chest discomfort 02/10/2014    Past Surgical History  Procedure Laterality Date  . Partial hysterectomy    . Cesarean section      No current outpatient prescriptions on file.  Allergies Review of patient's allergies indicates no known allergies.  Family History  Problem Relation Age of Onset  . Hypertension Mother   . Hyperlipidemia Mother   . Hypertension Father   . Hyperlipidemia Father   . Heart attack Paternal Aunt 69  . Heart attack Paternal Aunt 67    Social History History  Substance Use Topics  . Smoking status: Never Smoker   . Smokeless tobacco: Not on file  . Alcohol Use: Yes     Comment: occas wine.    Review of Systems  Constitutional: Negative for fever. Eyes: Negative for visual changes. ENT: Negative for sore throat. Cardiovascular: Positive for chest pain. Respiratory: Negative for shortness of breath. Gastrointestinal: Positive for abdominal pain. Genitourinary: Negative for dysuria. Musculoskeletal: Negative for back pain. Skin: Negative for rash. Neurological: Negative for  headaches, focal weakness or numbness.   10-point ROS otherwise negative.  ____________________________________________   PHYSICAL EXAM:  VITAL SIGNS: ED Triage Vitals  Enc Vitals Group     BP 12/10/14 2336 125/91 mmHg     Pulse Rate 12/10/14 2336 90     Resp 12/10/14 2336 18     Temp 12/10/14 2336 98.1 F (36.7 C)     Temp Source 12/10/14 2336 Oral     SpO2 12/10/14 2336 99 %     Weight 12/10/14 2335 145 lb (65.772 kg)     Height 12/10/14 2335 5\' 5"  (1.651 m)     Head Cir --      Peak Flow --      Pain Score 12/10/14 2336 10     Pain Loc --      Pain Edu? --      Excl. in [place]? --      Constitutional: Alert and oriented. Well appearing and in no distress. Eyes: Conjunctivae are normal. PERRL. Normal extraocular movements. ENT   Head: Normocephalic and atraumatic.   Nose: No congestion/rhinnorhea.   Mouth/Throat: Mucous membranes are moist.   Neck: No stridor. Hematological/Lymphatic/Immunilogical: No cervical lymphadenopathy. Cardiovascular: Normal rate, regular rhythm. Normal and symmetric distal pulses are present in all extremities. No murmurs, rubs, or gallops. Respiratory: Normal respiratory effort without tachypnea nor retractions. Breath sounds are clear and equal bilaterally. No wheezes/rales/rhonchi. Gastrointestinal: Pain with palpation of the right upper quadrant/epigastric region. No distention. There is no CVA tenderness. Genitourinary: deferred Musculoskeletal: Nontender with normal range of motion  in all extremities. No joint effusions.  No lower extremity tenderness nor edema. Neurologic:  Normal speech and language. No gross focal neurologic deficits are appreciated. Speech is normal.  Skin:  Skin is warm, dry and intact. No rash noted. Psychiatric: Mood and affect are normal. Speech and behavior are normal. Patient exhibits appropriate insight and judgment.  ____________________________________________    LABS (pertinent  positives/negatives)  Labs Reviewed  CBC - Abnormal; Notable for the following:    WBC 11.5 (*)    Hemoglobin 11.8 (*)    MCHC 31.8 (*)    All other components within normal limits  COMPREHENSIVE METABOLIC PANEL - Abnormal; Notable for the following:    Potassium 3.2 (*)    Glucose, Bld 120 (*)    Calcium 8.8 (*)    AST 102 (*)    ALT 59 (*)    All other components within normal limits  TROPONIN I  CK    ____________________________________________   EKG   Date: 12/11/2014  Rate: 74  Rhythm: normal sinus rhythm  QRS Axis: normal  Intervals: normal  ST/T Wave abnormalities: normal  Conduction Disutrbances: none  Narrative Interpretation: unremarkable         INITIAL IMPRESSION / ASSESSMENT AND PLAN / ED COURSE  Pertinent labs & imaging results that were available during my care of the patient were reviewed by me and considered in my medical decision making (see chart for details).  Physical exam concerning for possible gastroesophageal reflux disease versus cholelithiasis. As such lab studies were ordered and ultrasound. Patient and husband requested to leave AMA prior to return of lab data this was secondary to the fact that the patient's husband needed to go to work as morning. I offered to give her husband a doctor's note however this was declined.  ____________________________________________   FINAL CLINICAL IMPRESSION(S) / ED DIAGNOSES  Final diagnoses:  None      Gregor Hams, MD 12/11/14 [phone]

## 2014-12-11 NOTE — ED Notes (Signed)
Pt states right upper quadrant pain that began yesterday. Pt states has had nausea, vomiting, no diarrhea. Pt states has had chills.

## 2014-12-12 ENCOUNTER — Encounter: Payer: Self-pay | Admitting: Internal Medicine

## 2014-12-12 ENCOUNTER — Inpatient Hospital Stay (HOSPITAL_COMMUNITY)
Admission: AD | Admit: 2014-12-12 | Discharge: 2014-12-15 | DRG: 419 | Disposition: A | Discharge status: Home / Self Care | Payer: Commercial Managed Care - HMO | Source: Other Acute Inpatient Hospital | Attending: Internal Medicine | Admitting: Internal Medicine

## 2014-12-12 ENCOUNTER — Encounter (HOSPITAL_COMMUNITY): Payer: Self-pay

## 2014-12-12 DIAGNOSIS — Z9049 Acquired absence of other specified parts of digestive tract: Secondary | ICD-10-CM | POA: Diagnosis present

## 2014-12-12 DIAGNOSIS — E785 Hyperlipidemia, unspecified: Secondary | ICD-10-CM | POA: Diagnosis present

## 2014-12-12 DIAGNOSIS — K8065 Calculus of gallbladder and bile duct with chronic cholecystitis with obstruction: Secondary | ICD-10-CM | POA: Diagnosis present

## 2014-12-12 DIAGNOSIS — N3090 Cystitis, unspecified without hematuria: Secondary | ICD-10-CM | POA: Diagnosis present

## 2014-12-12 DIAGNOSIS — I10 Essential (primary) hypertension: Secondary | ICD-10-CM | POA: Diagnosis present

## 2014-12-12 DIAGNOSIS — E876 Hypokalemia: Secondary | ICD-10-CM | POA: Diagnosis not present

## 2014-12-12 DIAGNOSIS — R739 Hyperglycemia, unspecified: Secondary | ICD-10-CM | POA: Diagnosis present

## 2014-12-12 DIAGNOSIS — D649 Anemia, unspecified: Secondary | ICD-10-CM | POA: Diagnosis present

## 2014-12-12 DIAGNOSIS — Z8249 Family history of ischemic heart disease and other diseases of the circulatory system: Secondary | ICD-10-CM | POA: Diagnosis not present

## 2014-12-12 DIAGNOSIS — Z9884 Bariatric surgery status: Secondary | ICD-10-CM | POA: Diagnosis not present

## 2014-12-12 DIAGNOSIS — Z79899 Other long term (current) drug therapy: Secondary | ICD-10-CM | POA: Diagnosis not present

## 2014-12-12 DIAGNOSIS — K8071 Calculus of gallbladder and bile duct without cholecystitis with obstruction: Secondary | ICD-10-CM | POA: Diagnosis not present

## 2014-12-12 DIAGNOSIS — K8050 Calculus of bile duct without cholangitis or cholecystitis without obstruction: Secondary | ICD-10-CM | POA: Diagnosis present

## 2014-12-12 DIAGNOSIS — N390 Urinary tract infection, site not specified: Secondary | ICD-10-CM

## 2014-12-12 DIAGNOSIS — E669 Obesity, unspecified: Secondary | ICD-10-CM | POA: Diagnosis present

## 2014-12-12 DIAGNOSIS — K8020 Calculus of gallbladder without cholecystitis without obstruction: Secondary | ICD-10-CM

## 2014-12-12 DIAGNOSIS — E049 Nontoxic goiter, unspecified: Secondary | ICD-10-CM | POA: Diagnosis present

## 2014-12-12 DIAGNOSIS — N3000 Acute cystitis without hematuria: Secondary | ICD-10-CM | POA: Diagnosis not present

## 2014-12-12 DIAGNOSIS — E039 Hypothyroidism, unspecified: Secondary | ICD-10-CM | POA: Diagnosis present

## 2014-12-12 DIAGNOSIS — K8001 Calculus of gallbladder with acute cholecystitis with obstruction: Secondary | ICD-10-CM | POA: Diagnosis present

## 2014-12-12 DIAGNOSIS — Z6825 Body mass index (BMI) 25.0-25.9, adult: Secondary | ICD-10-CM | POA: Diagnosis not present

## 2014-12-12 LAB — COMPREHENSIVE METABOLIC PANEL
ALT: 502 U/L — ABNORMAL HIGH (ref 14–54)
ANION GAP: 5 (ref 5–15)
AST: 424 U/L — ABNORMAL HIGH (ref 15–41)
Albumin: 3.4 g/dL — ABNORMAL LOW (ref 3.5–5.0)
Alkaline Phosphatase: 89 U/L (ref 38–126)
BUN: 7 mg/dL (ref 6–20)
CHLORIDE: 108 mmol/L (ref 101–111)
CO2: 28 mmol/L (ref 22–32)
Calcium: 8.6 mg/dL — ABNORMAL LOW (ref 8.9–10.3)
Creatinine, Ser: 0.76 mg/dL (ref 0.44–1.00)
GFR calc non Af Amer: >60 mL/min (ref >60)
Glucose, Bld: 90 mg/dL (ref 65–99)
POTASSIUM: 3.7 mmol/L (ref 3.5–5.1)
SODIUM: 141 mmol/L (ref 135–145)
TOTAL PROTEIN: 6.1 g/dL — AB (ref 6.5–8.1)
Total Bilirubin: 3.1 mg/dL — ABNORMAL HIGH (ref 0.3–1.2)

## 2014-12-12 LAB — CREATININE, SERUM
Creatinine, Ser: 1.01 mg/dL — ABNORMAL HIGH (ref 0.44–1.00)
GFR calc Af Amer: >60 mL/min (ref >60)
GFR calc non Af Amer: >60 mL/min (ref >60)

## 2014-12-12 LAB — CBC
HCT: 34.2 % — ABNORMAL LOW (ref 35.0–47.0)
HEMATOCRIT: 35.5 % — AB (ref 36.0–46.0)
Hemoglobin: 11.1 g/dL — ABNORMAL LOW (ref 12.0–16.0)
Hemoglobin: 11.3 g/dL — ABNORMAL LOW (ref 12.0–15.0)
MCH: 26.9 pg (ref 26.0–34.0)
MCH: 26.2 pg (ref 26.0–34.0)
MCHC: 32.4 g/dL (ref 32.0–36.0)
MCHC: 31.8 g/dL (ref 30.0–36.0)
MCV: 82.9 fL (ref 80.0–100.0)
MCV: 82.2 fL (ref 78.0–100.0)
Platelets: 196 10*3/uL (ref 150–440)
Platelets: 227 10*3/uL (ref 150–400)
RBC: 4.12 MIL/uL (ref 3.80–5.20)
RBC: 4.32 MIL/uL (ref 3.87–5.11)
RDW: 13.1 % (ref 11.5–14.5)
RDW: 12.9 % (ref 11.5–15.5)
WBC: 5.6 10*3/uL (ref 3.6–11.0)
WBC: 5.5 10*3/uL (ref 4.0–10.5)

## 2014-12-12 MED ORDER — MORPHINE SULFATE 2 MG/ML IJ SOLN
2.0000 mg | INTRAMUSCULAR | Status: DC | PRN
  Administered 2014-12-12: 2 mg via INTRAVENOUS

## 2014-12-12 MED ORDER — SODIUM CHLORIDE 0.9 % IV SOLN
INTRAVENOUS | Status: DC
Start: 2014-12-12 — End: 2014-12-15
  Administered 2014-12-12 – 2014-12-13 (×3): via INTRAVENOUS

## 2014-12-12 MED ORDER — OXYCODONE HCL 5 MG PO TABS: 5.0000 mg | ORAL_TABLET | ORAL | Status: DC | PRN

## 2014-12-12 MED ORDER — ONDANSETRON HCL 4 MG/2ML IJ SOLN: 4.0000 mg | Freq: Four times a day (QID) | INTRAMUSCULAR | Status: DC | PRN

## 2014-12-12 MED ORDER — ONDANSETRON HCL 4 MG/2ML IJ SOLN
4.0000 mg | Freq: Once | INTRAMUSCULAR | Status: AC
  Administered 2014-12-12: 4 mg via INTRAVENOUS

## 2014-12-12 MED ORDER — MORPHINE SULFATE 2 MG/ML IJ SOLN
2.0000 mg | INTRAMUSCULAR | Status: DC | PRN
  Filled 2014-12-12: qty 1

## 2014-12-12 MED ORDER — ACETAMINOPHEN 650 MG RE SUPP: 650.0000 mg | Freq: Four times a day (QID) | RECTAL | Status: DC | PRN

## 2014-12-12 MED ORDER — MORPHINE SULFATE 2 MG/ML IJ SOLN
INTRAMUSCULAR | Status: AC
  Administered 2014-12-12: 2 mg via INTRAVENOUS
  Filled 2014-12-12: qty 1

## 2014-12-12 MED ORDER — ACETAMINOPHEN 325 MG PO TABS
650.0000 mg | ORAL_TABLET | Freq: Four times a day (QID) | ORAL | Status: DC | PRN
  Filled 2014-12-12: qty 2

## 2014-12-12 MED ORDER — POLYETHYLENE GLYCOL 3350 17 G PO PACK: 17.0000 g | PACK | Freq: Every day | ORAL | Status: DC | PRN

## 2014-12-12 MED ORDER — ALBUTEROL SULFATE (2.5 MG/3ML) 0.083% IN NEBU: 2.5000 mg | INHALATION_SOLUTION | RESPIRATORY_TRACT | Status: DC | PRN

## 2014-12-12 MED ORDER — HYDROCODONE-ACETAMINOPHEN 5-325 MG PO TABS
1.0000 | ORAL_TABLET | ORAL | Status: DC | PRN
  Administered 2014-12-14: 1 via ORAL
  Filled 2014-12-12: qty 1

## 2014-12-12 MED ORDER — CIPROFLOXACIN IN D5W 400 MG/200ML IV SOLN
400.0000 mg | Freq: Once | INTRAVENOUS | Status: AC
  Administered 2014-12-12: 400 mg via INTRAVENOUS

## 2014-12-12 MED ORDER — ONDANSETRON HCL 4 MG PO TABS: 4.0000 mg | ORAL_TABLET | Freq: Four times a day (QID) | ORAL | Status: DC | PRN

## 2014-12-12 MED ORDER — ONDANSETRON HCL 4 MG/2ML IJ SOLN
INTRAMUSCULAR | Status: AC
  Administered 2014-12-12: 4 mg via INTRAVENOUS
  Filled 2014-12-12: qty 2

## 2014-12-12 MED ORDER — OXYCODONE HCL 5 MG PO TABS
5.0000 mg | ORAL_TABLET | ORAL | Status: DC | PRN
  Administered 2014-12-12: 5 mg via ORAL
  Filled 2014-12-12: qty 1

## 2014-12-12 MED ORDER — MORPHINE SULFATE 2 MG/ML IJ SOLN: 2.0000 mg | INTRAMUSCULAR | Status: DC | PRN

## 2014-12-12 MED ORDER — SODIUM CHLORIDE 0.9 % IV SOLN
INTRAVENOUS | Status: DC
  Administered 2014-12-12 (×2): via INTRAVENOUS

## 2014-12-12 MED ORDER — ACETAMINOPHEN 325 MG PO TABS
650.0000 mg | ORAL_TABLET | Freq: Four times a day (QID) | ORAL | Status: DC | PRN
  Administered 2014-12-12: 650 mg via ORAL

## 2014-12-12 MED ORDER — HEPARIN SODIUM (PORCINE) 5000 UNIT/ML IJ SOLN
5000.0000 [IU] | Freq: Three times a day (TID) | INTRAMUSCULAR | Status: DC
  Administered 2014-12-12 – 2014-12-15 (×5): 5000 [IU] via SUBCUTANEOUS
  Filled 2014-12-12 (×4): qty 1

## 2014-12-12 MED ORDER — HEPARIN SODIUM (PORCINE) 5000 UNIT/ML IJ SOLN
5000.0000 [IU] | Freq: Three times a day (TID) | INTRAMUSCULAR | Status: DC
  Administered 2014-12-12: 5000 [IU] via SUBCUTANEOUS
  Filled 2014-12-12: qty 1

## 2014-12-12 MED ORDER — CIPROFLOXACIN IN D5W 400 MG/200ML IV SOLN
400.0000 mg | Freq: Two times a day (BID) | INTRAVENOUS | Status: DC
  Administered 2014-12-13 – 2014-12-15 (×5): 400 mg via INTRAVENOUS
  Filled 2014-12-12 (×6): qty 200

## 2014-12-12 MED ORDER — CIPROFLOXACIN IN D5W 400 MG/200ML IV SOLN: 400.0000 mg | Freq: Two times a day (BID) | INTRAVENOUS | Status: DC

## 2014-12-12 MED ORDER — CIPROFLOXACIN IN D5W 400 MG/200ML IV SOLN
400.0000 mg | Freq: Two times a day (BID) | INTRAVENOUS | Status: DC
  Filled 2014-12-12 (×3): qty 200

## 2014-12-12 MED ORDER — CIPROFLOXACIN IN D5W 400 MG/200ML IV SOLN
INTRAVENOUS | Status: AC
  Administered 2014-12-12: 400 mg via INTRAVENOUS
  Filled 2014-12-12: qty 200

## 2014-12-12 MED ORDER — BISACODYL 10 MG RE SUPP: 10.0000 mg | Freq: Every day | RECTAL | Status: DC | PRN

## 2014-12-12 MED ORDER — MORPHINE SULFATE 2 MG/ML IJ SOLN
2.0000 mg | Freq: Once | INTRAMUSCULAR | Status: AC
  Administered 2014-12-12: 2 mg via INTRAVENOUS

## 2014-12-12 MED ORDER — SODIUM CHLORIDE 0.9 % IV SOLN: INTRAVENOUS | Status: DC

## 2014-12-12 NOTE — Discharge Summary (Signed)
[facility]   PATIENT NAME: [name]    MR#:  [id number]  DATE OF BIRTH:  [birth year]  DATE OF ADMISSION:  12/11/2014 ADMITTING PHYSICIAN: Lytle Butte, MD  DATE OF DISCHARGE:  12/12/2014  PRIMARY CARE PHYSICIAN: JADALI,FAYEGH, MD    ADMISSION DIAGNOSIS:  Calculus of gallbladder with acute cholecystitis and obstruction [K80.01] Choledocholithiasis [K80.50] RUQ pain [R10.11]  DISCHARGE DIAGNOSIS:  Active Problems:   Choledocholithiasis   SECONDARY DIAGNOSIS:   Past Medical History  Diagnosis Date  . Hyperlipidemia   . Hypertension   . Hyperglycemia   . Hypothyroidism   . Thyroid mass   . Obesity     HOSPITAL COURSE:   1) Choledocholithiasis, cholelithiasis without cholecystitis: I have discussed with gastroenterology at [facility], Dr. Gustavo Lah, she needs an ERCP. Unfortunately, we are unable to provide this procedure over the weekend. I have discussed with [facility], Dr. Benson Norway, who has agreed to perform the procedure and Dr. Dyann Kief who has accepted the patient to the medicine service. She will be transferred to [facility] this afternoon. She is currently nothing by mouth. Pain controlled with IV morphine. Receiving normal saline. She does have an increasing transaminitis due to the obstruction. She is in agreement with the plan for transfer.  #2 urinary tract infection: Presenting UA with TMTC white blood cells. She is on IV ciprofloxacin for this. Cultures unfortunately not collected on admission.   DISCHARGE CONDITIONS:   Fair  CONSULTS OBTAINED:  Treatment Team:  Lytle Butte, MD Lollie Sails, MD Carol Ada, MD  DRUG ALLERGIES:  No Known Allergies  DISCHARGE MEDICATIONS:   Current Discharge Medication List    START taking these medications   Details  ciprofloxacin (CIPRO) 400 MG/200ML SOLN Inject 200 mLs (400 mg total) into the vein every 12 (twelve)  hours. Qty: 2000 mL, Refills: 0    morphine 2 MG/ML injection Inject 1 mL (2 mg total) into the vein every 4 (four) hours as needed. Qty: 1 mL, Refills: 0    ondansetron (ZOFRAN) 4 MG tablet Take 1 tablet (4 mg total) by mouth every 6 (six) hours as needed for nausea. Qty: 20 tablet, Refills: 0    oxyCODONE (OXY IR/ROXICODONE) 5 MG immediate release tablet Take 1 tablet (5 mg total) by mouth every 4 (four) hours as needed for moderate pain. Qty: 30 tablet, Refills: 0      STOP taking these medications     dexlansoprazole (DEXILANT) 60 MG capsule          DISCHARGE INSTRUCTIONS:   Transferred to [facility] for ERCP not available at [facility]:   Chief Complaint  Patient presents with  . Abdominal Pain    HISTORY OF PRESENT ILLNESS:  Diana Trinna Balloon is a 39 y.o. female with a known history of gastric sleeve placement presenting with abdominal pain. 2 day duration of abdominal pain right upper quadrant radiation to right shoulder. Describes pain as dull in quality, 7-8/10 in intensity, worse with movement, deep breathing, no relieving factors. Associated nausea, by mouth intolerance denies any frank vomiting, denies fevers chills. Emergency department course: Noted to have choledocholithiasis with cholecystitis emergency room doctor discussed case with general surgery. Recommend gastroenterology consult for ERCP  VITAL SIGNS:  Blood pressure 116/82, pulse 58, temperature 98.1 F (36.7 C), temperature source Oral, resp. rate 16, height 5\' 5"  (1.651 m), weight 68.947 kg (152 lb), SpO2 100 %.  I/O:   Intake/Output Summary (Last 24 hours) at 12/12/14 1209 Last data filed at 12/12/14 1201  Gross per 24 hour  Intake 579.51 ml  Output      0 ml  Net 579.51 ml    PHYSICAL EXAMINATION:  GENERAL:  39 y.o.-year-old patient lying in the bed, uncomfortable EYES: Pupils equal, round, reactive to light and accommodation. No scleral icterus. Extraocular  muscles intact.  HEENT: Head atraumatic, normocephalic. Oropharynx and nasopharynx clear. Oral mucous membranes are dry NECK:  Supple, no jugular venous distention. No thyroid enlargement, no tenderness.  LUNGS: Normal breath sounds bilaterally, no wheezing, rales,rhonchi or crepitation. No use of accessory muscles of respiration.  CARDIOVASCULAR: S1, S2 normal. No murmurs, rubs, or gallops.  ABDOMEN: Soft, diffusely tender worst in the right upper quadrant, non-distended. Bowel sounds present. No organomegaly or mass.  EXTREMITIES: No pedal edema, cyanosis, or clubbing.  NEUROLOGIC: Cranial nerves II through XII are intact. Muscle strength 5/5 in all extremities. Sensation intact. Gait not checked.  PSYCHIATRIC: The patient is alert and oriented x 3.  SKIN: No obvious rash, lesion, or ulcer.   DATA REVIEW:   CBC  Recent Labs Lab 12/12/14 0411  WBC 5.6  HGB 11.1*  HCT 34.2*  PLT 196    Chemistries   Recent Labs Lab 12/12/14 0411  NA 141  K 3.7  CL 108  CO2 28  GLUCOSE 90  BUN 7  CREATININE 0.76  CALCIUM 8.6*  AST 424*  ALT 502*  ALKPHOS 89  BILITOT 3.1*    Cardiac Enzymes  Recent Labs Lab 12/11/14 0050  TROPONINI <0.03    Microbiology Results  No results found for this or any previous visit.  RADIOLOGY:  US Abdomen Limited Ruq  12/12/2014   CLINICAL DATA:  Acute onset of right upper quadrant abdominal pain. Initial encounter.  EXAM: US ABDOMEN LIMITED - RIGHT UPPER QUADRANT  COMPARISON:  None.  FINDINGS: Gallbladder:  Scattered small stones are seen dependently within the gallbladder. The gallbladder is otherwise unremarkable. No gallbladder wall thickening or pericholecystic fluid is seen. However, a positive ultrasonographic Murphy's sign is elicited.  Common bile duct:  Diameter: 0.8 cm, dilated in appearance.  Liver:  No focal lesion identified. Within normal limits in parenchymal echogenicity. Mild intrahepatic biliary ductal dilatation is seen.   IMPRESSION: Dilatation of the common bile duct and mild intrahepatic biliary ductal dilatation suggests distal common bile duct obstruction due to a stone. Cholelithiasis noted. Positive ultrasonographic Murphy's sign likely also reflects obstruction. No evidence for cholecystitis.   Electronically Signed   By: Garald Balding M.D.   On: 12/12/2014 00:28    EKG:   Orders placed or performed during the hospital encounter of 12/10/14  . EKG      Management plans discussed with the patient, family and they are in agreement.  CODE STATUS:     Code Status Orders        Start     Ordered   12/12/14 0151  Full code   Continuous     12/12/14 0151      TOTAL TIME TAKING CARE OF THIS PATIENT: 40 minutes.    Myrtis Ser M.D on 12/12/2014 at 12:09 PM  Between 7am to 6pm - Pager - [phone]  After 6pm go to [email or web address] - password [email or web address] [facility]  Office  [phone]  CC: Primary care physician; [facility], MD

## 2014-12-12 NOTE — ED Provider Notes (Signed)
[facility] Emergency Department Provider Note  ____________________________________________  Time seen: 1:00  I have reviewed the triage vital signs and the nursing notes.   HISTORY  Chief Complaint Abdominal Pain      HPI [name] is a 39 y.o. female returns to the emergency department with worsening right upper quadrant pain nausea and vomiting. In addition patient also admits to chills. Patient was seen in the emergency department yesterday for the same however the left AMA before ultrasound abdomen performed and lab data returned.     Past Medical History  Diagnosis Date  . Hyperlipidemia   . Hypertension   . Hyperglycemia   . Hypothyroidism   . Thyroid mass   . Obesity     Patient Active Problem List   Diagnosis Date Noted  . Chest discomfort 02/10/2014    Past Surgical History  Procedure Laterality Date  . Partial hysterectomy    . Cesarean section      No current outpatient prescriptions on file.  Allergies Review of patient's allergies indicates no known allergies.  Family History  Problem Relation Age of Onset  . Hypertension Mother   . Hyperlipidemia Mother   . Hypertension Father   . Hyperlipidemia Father   . Heart attack Paternal Aunt 3  . Heart attack Paternal Aunt 40    Social History History  Substance Use Topics  . Smoking status: Never Smoker   . Smokeless tobacco: Not on file  . Alcohol Use: Yes     Comment: occas wine.    Review of Systems  Constitutional: Negative for fever. Eyes: Negative for visual changes. ENT: Negative for sore throat. Cardiovascular: Negative for chest pain. Respiratory: Negative for shortness of breath. Gastrointestinal: Positive for abdominal pain, vomiting Genitourinary: Negative for dysuria. Musculoskeletal: Negative for back pain. Skin: Negative for rash. Neurological: Negative for headaches, focal weakness or numbness.   10-point ROS otherwise  negative.  ____________________________________________   PHYSICAL EXAM:  VITAL SIGNS: ED Triage Vitals  Enc Vitals Group     BP 12/11/14 2157 134/95 mmHg     Pulse Rate 12/11/14 2157 65     Resp 12/11/14 2157 18     Temp 12/11/14 2157 98.2 F (36.8 C)     Temp Source 12/11/14 2157 Oral     SpO2 12/11/14 2157 100 %     Weight 12/11/14 2157 145 lb (65.772 kg)     Height 12/11/14 2157 5\' 5"  (1.651 m)     Head Cir --      Peak Flow --      Pain Score 12/11/14 2157 7     Pain Loc --      Pain Edu? --      Excl. in [place]? --      Constitutional: Alert and oriented. Well appearing and in no distress. Eyes: Conjunctivae are normal. PERRL. Normal extraocular movements. ENT   Head: Normocephalic and atraumatic.   Nose: No congestion/rhinnorhea.   Mouth/Throat: Mucous membranes are moist.   Neck: No stridor. Cardiovascular: Normal rate, regular rhythm. Normal and symmetric distal pulses are present in all extremities. No murmurs, rubs, or gallops. Respiratory: Normal respiratory effort without tachypnea nor retractions. Breath sounds are clear and equal bilaterally. No wheezes/rales/rhonchi. Gastrointestinal: Pain with palpation right upper quadrant. No distention. There is no CVA tenderness. Genitourinary: deferred Musculoskeletal: Nontender with normal range of motion in all extremities. No joint effusions.  No lower extremity tenderness nor edema. Neurologic:  Normal speech and language. No gross  focal neurologic deficits are appreciated. Speech is normal.  Skin:  Skin is warm, dry and intact. No rash noted. Psychiatric: Mood and affect are normal. Speech and behavior are normal. Patient exhibits appropriate insight and judgment.  ____________________________________________    LABS (pertinent positives/negatives)  Labs Reviewed  CBC WITH DIFFERENTIAL/PLATELET - Abnormal; Notable for the following:    Hemoglobin 11.8 (*)    MCHC 31.8 (*)    Lymphs Abs 0.8 (*)     All other components within normal limits  COMPREHENSIVE METABOLIC PANEL - Abnormal; Notable for the following:    Glucose, Bld 104 (*)    AST 489 (*)    ALT 518 (*)    Total Bilirubin 3.0 (*)    All other components within normal limits  URINALYSIS COMPLETEWITH MICROSCOPIC ([facility] ONLY) - Abnormal; Notable for the following:    Color, Urine AMBER (*)    APPearance CLEAR (*)    Bilirubin Urine 1+ (*)    Ketones, ur 1+ (*)    Protein, ur 30 (*)    Nitrite POSITIVE (*)    Leukocytes, UA 2+ (*)    Bacteria, UA FEW (*)    Squamous Epithelial / LPF 0-5 (*)    All other components within normal limits     ____________________________________________   EKG   Date: 12/12/2014  Rate: 90  Rhythm: normal sinus rhythm  QRS Axis: normal  Intervals: normal  ST/T Wave abnormalities: normal  Conduction Disutrbances: none  Narrative Interpretation: unremarkable        RADIOLOGY  Ultrasound of the abdomen revealed: IMPRESSION: Dilatation of the common bile duct and mild intrahepatic biliary ductal dilatation suggests distal common bile duct obstruction due to a stone. Cholelithiasis noted. Positive ultrasonographic Murphy's sign likely also reflects obstruction. No evidence for cholecystitis.  ____________________________________________     INITIAL IMPRESSION / ASSESSMENT AND PLAN / ED COURSE  Pertinent labs & imaging results that were available during my care of the patient were reviewed by me and considered in my medical decision making (see chart for details).  History of physical exam consistent with choledocholithiasis, patient has elevated liver enzymes in comparison to the day before. In addition patient also has a urinary tract infection which she will receive ciprofloxacin. Patient discussed with Dr. Bary Castilla (general surgery) and Dr. Lavetta Nielsen for admission __________________________________________   FINAL CLINICAL IMPRESSION(S) / ED DIAGNOSES  Final  diagnoses:  Choledocholithiasis  Calculus of gallbladder with acute cholecystitis and obstruction      Gregor Hams, MD 12/12/14 [phone]

## 2014-12-12 NOTE — H&P (Signed)
Patient Demographics  [name], is a 39 y.o. female  MRN: [id number]   DOB - 20-Nov-1975  Admit Date - 12/12/2014  Outpatient Primary MD for the patient is [facility], MD   With History of -  Past Medical History  Diagnosis Date  . Hyperlipidemia   . Hypertension   . Hyperglycemia   . Hypothyroidism   . Thyroid mass   . Obesity       Past Surgical History  Procedure Laterality Date  . Partial hysterectomy    . Cesarean section    . Sleeve gastroplasty      in for   No chief complaint on file.    HPI  Diana Trinna Balloon  is a 39 y.o. female, with history of gastric sleeve surgery, presents to [facility] on 7/1 with abdominal pain, associated by nausea, but denies any vomiting fever or chills, ultrasound and [place] showing choledocholithiasis with no evidence of cholecystitis , As well her workup was significant for UTI, she was started on Cipro, given [facility] cannot do ERCP this weekend, transfer was requested to [facility], and patient was accepted after referring physician discussed with GI Dr. Benson Norway, with plan for ERCP tomorrow a.m.    Review of Systems    In addition to the HPI above, No Fever-chills, No Headache, No changes with Vision or hearing, No problems swallowing food or Liquids, No Chest pain, Cough or Shortness of Breath  complains of  Abdominal pain,and nausea , denies  Vommitting, Bowel movements are regular, No Blood in stool or Urine, No dysuria, No new skin rashes or bruises, No new joints pains-aches,  No new weakness, tingling, numbness in any extremity, No recent weight gain or loss, No polyuria, polydypsia or polyphagia, No significant Mental Stressors.  A full 10 point Review of Systems was done, except as stated above, all other Review of Systems were negative.   Social History History  Substance Use Topics  . Smoking status: Never Smoker   . Smokeless tobacco: Not on file  . Alcohol Use: Yes     Comment:  occas wine.     Family History Family History  Problem Relation Age of Onset  . Hypertension Mother   . Hyperlipidemia Mother   . Hypertension Father   . Hyperlipidemia Father   . Heart attack Paternal Aunt 23  . Heart attack Paternal Aunt 21     Prior to Admission medications   Medication Sig Start Date End Date Taking? Authorizing Provider  B Complex Vitamins (VITAMIN-B COMPLEX) TABS Take 1 tablet by mouth daily.    Historical Provider, MD  ciprofloxacin (CIPRO) 400 MG/200ML SOLN Inject 200 mLs (400 mg total) into the vein every 12 (twelve) hours. 12/12/14   Aldean Jewett, MD  FOLIC ACID PO Take 1 tablet by mouth daily.    Historical Provider, MD  morphine 2 MG/ML injection Inject 1 mL (2 mg total) into the vein every 4 (four) hours as needed. 12/12/14   Aldean Jewett, MD  ondansetron (ZOFRAN) 4 MG tablet Take 1 tablet (4 mg total) by mouth every 6 (six) hours as needed for nausea. 12/12/14   Aldean Jewett, MD  oxyCODONE (OXY IR/ROXICODONE) 5 MG immediate release tablet Take 1 tablet (5 mg total) by mouth every 4 (four) hours as needed for moderate pain. 12/12/14   Aldean Jewett, MD    No Known Allergies  Physical Exam  Vitals  Blood pressure 118/74, pulse 57, temperature 98.5 F (  36.9 C), temperature source Oral, resp. rate 16, height 5\' 5"  (1.651 m), weight 71.1 kg (156 lb 12 oz), SpO2 100 %.   1. General Well-developed female laying in bed in NAD,   2. Normal affect and insight, Not Suicidal or Homicidal, Awake Alert, Oriented X 3.  3. No F.N deficits, ALL C.Nerves Intact, Strength 5/5 all 4 extremities, Sensation intact all 4 extremities, Plantars down going.  4. Ears and Eyes appear Normal, Conjunctivae clear, PERRLA. Moist Oral Mucosa.  5. Supple Neck, No JVD, No cervical lymphadenopathy appriciated, No Carotid Bruits.  6. Symmetrical Chest wall movement, Good air movement bilaterally, CTAB.  7. RRR, No Gallops, Rubs or Murmurs, No Parasternal  Heave.  8. Positive Bowel Sounds, Abdomen Softepigastric and right upper quadrant,rganomegaly appriciated,No rebound -guarding or rigidity.  9.  No Cyanosis, Normal Skin Turgor, No Skin Rash or Bruise.  10. Good muscle tone,  joints appear normal , no effusions, Normal ROM.  11. No Palpable Lymph Nodes in Neck or Axillae   Data Review  CBC  Recent Labs Lab 12/11/14 0050 12/11/14 2218 12/12/14 0411  WBC 11.5* 7.9 5.6  HGB 11.8* 11.8* 11.1*  HCT 37.3 37.2 34.2*  PLT 229 236 196  MCV 82.8 82.7 82.9  MCH 26.3 26.3 26.9  MCHC 31.8* 31.8* 32.4  RDW 13.1 13.1 13.1  LYMPHSABS  --  0.8*  --   MONOABS  --  0.5  --   EOSABS  --  0.0  --   BASOSABS  --  0.1  --    ------------------------------------------------------------------------------------------------------------------  Chemistries   Recent Labs Lab 12/11/14 0050 12/11/14 2218 12/12/14 0411  NA 142 141 141  K 3.2* 4.2 3.7  CL 107 107 108  CO2 26 26 28   GLUCOSE 120* 104* 90  BUN 11 7 7   CREATININE 0.88 0.69 0.76  CALCIUM 8.8* 9.0 8.6*  AST 102* 489* 424*  ALT 59* 518* 502*  ALKPHOS 46 89 89  BILITOT 0.5 3.0* 3.1*   ------------------------------------------------------------------------------------------------------------------ estimated creatinine clearance is 94.2 mL/min (by C-G formula based on Cr of 0.76). ------------------------------------------------------------------------------------------------------------------ No results for input(s): TSH, T4TOTAL, T3FREE, THYROIDAB in the last 72 hours.  Invalid input(s): FREET3   Coagulation profile No results for input(s): INR, PROTIME in the last 168 hours. ------------------------------------------------------------------------------------------------------------------- No results for input(s): DDIMER in the last 72 hours. -------------------------------------------------------------------------------------------------------------------  Cardiac  Enzymes  Recent Labs Lab 12/11/14 0050  TROPONINI <0.03   ------------------------------------------------------------------------------------------------------------------ Invalid input(s): POCBNP   ---------------------------------------------------------------------------------------------------------------  Urinalysis    Component Value Date/Time   COLORURINE AMBER* 12/11/2014 2218   APPEARANCEUR CLEAR* 12/11/2014 2218   LABSPEC 1.023 12/11/2014 2218   PHURINE 5.0 12/11/2014 2218   GLUCOSEU NEGATIVE 12/11/2014 2218   HGBUR NEGATIVE 12/11/2014 2218   BILIRUBINUR 1+* 12/11/2014 2218   KETONESUR 1+* 12/11/2014 2218   PROTEINUR 30* 12/11/2014 2218   NITRITE POSITIVE* 12/11/2014 2218   LEUKOCYTESUR 2+* 12/11/2014 2218    ----------------------------------------------------------------------------------------------------------------  Imaging results:   US Abdomen Limited Ruq  12/12/2014   CLINICAL DATA:  Acute onset of right upper quadrant abdominal pain. Initial encounter.  EXAM: US ABDOMEN LIMITED - RIGHT UPPER QUADRANT  COMPARISON:  None.  FINDINGS: Gallbladder:  Scattered small stones are seen dependently within the gallbladder. The gallbladder is otherwise unremarkable. No gallbladder wall thickening or pericholecystic fluid is seen. However, a positive ultrasonographic Murphy's sign is elicited.  Common bile duct:  Diameter: 0.8 cm, dilated in appearance.  Liver:  No focal lesion identified. Within normal limits in parenchymal  echogenicity. Mild intrahepatic biliary ductal dilatation is seen.  IMPRESSION: Dilatation of the common bile duct and mild intrahepatic biliary ductal dilatation suggests distal common bile duct obstruction due to a stone. Cholelithiasis noted. Positive ultrasonographic Murphy's sign likely also reflects obstruction. No evidence for cholecystitis.   Electronically Signed   By: Garald Balding M.D.   On: 12/12/2014 00:28       Assessment &  Plan  Active Problems:   Choledocholithiasis   UTI (urinary tract infection)    Choledocholithiasis, cholelithiasis without cholecystitis - [place] [facility] with no ability to perform ERCP this weekend, patient accepted to [facility], discussed with Dr. Benson Norway, plan is to perform ERCP in a.m., keep clear liquid today, nothing by mouth after midnight, when necessary pain and nausea medication.  Transaminitis - Secondary to above, check CMP in a.m.   UTI - Continue with IV Cipro, unfortunately urine cultures was not collected on admission   DVT Prophylaxis Heparin -   AM Labs Ordered, also please review Full Orders  Family Communication: Admission, patients condition and plan of care including tests being ordered have been discussed with the patient and family who indicate understanding and agree with the plan and Code Status.  Code Status full  Likely DC to  home  Condition GUARDED    Time spent in minutes : 55 minutes    ELGERGAWY, DAWOOD M.D on 12/12/2014 at 4:10 PM  Between 7am to 7pm - Pager - [phone]  After 7pm go to [email or web address] - password [email or web address]  And look for the night coverage person covering me after hours  [facility] Office  [phone]

## 2014-12-12 NOTE — H&P (Signed)
[facility] [place] NAME: [name]    MR#:  [id number]  DATE OF BIRTH:  1976/04/01   DATE OF ADMISSION:  12/11/2014  PRIMARY CARE PHYSICIAN: Casilda Carls, MD   REQUESTING/REFERRING PHYSICIAN: Marjean Donna  CHIEF COMPLAINT:   Chief Complaint  Patient presents with  . Abdominal Pain    HISTORY OF PRESENT ILLNESS:  [name]  is a 39 y.o. female with a known history of gastric sleeve placement presenting with abdominal pain. 2 day duration of abdominal pain right upper quadrant radiation to right shoulder. Describes pain as dull in quality, 7-8/10 in intensity, worse with movement, deep breathing, no relieving factors. Associated nausea, by mouth intolerance denies any frank vomiting, denies fevers chills. Emergency department course: Noted to have choledocholithiasis with cholecystitis emergency room doctor discussed case with general surgery. Recommend gastroenterology consult for ERCP  PAST MEDICAL HISTORY:   Past Medical History  Diagnosis Date  . Hyperlipidemia   . Hypertension   . Hyperglycemia   . Hypothyroidism   . Thyroid mass   . Obesity     PAST SURGICAL HISTORY:   Past Surgical History  Procedure Laterality Date  . Partial hysterectomy    . Cesarean section      SOCIAL HISTORY:   History  Substance Use Topics  . Smoking status: Never Smoker   . Smokeless tobacco: Not on file  . Alcohol Use: Yes     Comment: occas wine.    FAMILY HISTORY:   Family History  Problem Relation Age of Onset  . Hypertension Mother   . Hyperlipidemia Mother   . Hypertension Father   . Hyperlipidemia Father   . Heart attack Paternal Aunt 13  . Heart attack Paternal Aunt 71    DRUG ALLERGIES:  No Known Allergies  REVIEW OF SYSTEMS:  REVIEW OF SYSTEMS:  CONSTITUTIONAL: Denies fevers, chills, fatigue, weakness.  EYES: Denies blurred vision, double vision, or eye pain.  EARS, NOSE, THROAT: Denies  tinnitus, ear pain, hearing loss.  RESPIRATORY: denies cough, shortness of breath, wheezing  CARDIOVASCULAR: Denies chest pain, palpitations, edema.  GASTROINTESTINAL: Positive nausea, , abdominal pain. Denies vomiting, diarrhea GENITOURINARY: Denies dysuria, hematuria. Positive dark urine ENDOCRINE: Denies nocturia or thyroid problems. HEMATOLOGIC AND LYMPHATIC: Denies easy bruising or bleeding.  SKIN: Denies rash or lesions.  MUSCULOSKELETAL: Denies pain in neck, back, shoulder, knees, hips, or further arthritic symptoms.  NEUROLOGIC: Denies paralysis, paresthesias.  PSYCHIATRIC: Denies anxiety or depressive symptoms. Otherwise full review of systems performed by me is negative.   MEDICATIONS AT HOME:   Prior to Admission medications   Not on File      VITAL SIGNS:  Blood pressure 138/98, pulse 60, temperature 98.1 F (36.7 C), temperature source Oral, resp. rate 18, height 5\' 5"  (1.651 m), weight 145 lb (65.772 kg), SpO2 100 %.  PHYSICAL EXAMINATION:  VITAL SIGNS: Filed Vitals:   12/12/14 0130  BP: 138/98  Pulse:   Temp:   Resp:    GENERAL:38 y.o.female currently in no acute distress.  HEAD: Normocephalic, atraumatic.  EYES: Pupils equal, round, reactive to light. Extraocular muscles intact. No scleral icterus.  MOUTH: Moist mucosal membrane. Dentition intact. No abscess noted.  EAR, NOSE, THROAT: Clear without exudates. No external lesions.  NECK: Supple. No thyromegaly. No nodules. No JVD.  PULMONARY: Clear to ascultation, without wheeze rails or rhonci. No use of accessory muscles, Good respiratory effort. good air entry bilaterally CHEST: Nontender to palpation.  CARDIOVASCULAR: S1  and S2. Regular rate and rhythm. No murmurs, rubs, or gallops. No edema. Pedal pulses 2+ bilaterally.  GASTROINTESTINAL: Soft, mild tenderness right upper quadrant without rebound or guarding, nondistended. No masses. Positive bowel sounds. No hepatosplenomegaly.  MUSCULOSKELETAL: No  swelling, clubbing, or edema. Range of motion full in all extremities.  NEUROLOGIC: Cranial nerves II through XII are intact. No gross focal neurological deficits. Sensation intact. Reflexes intact.  SKIN: No ulceration, lesions, rashes, or cyanosis. Skin warm and dry. Turgor intact.  PSYCHIATRIC: Mood, affect within normal limits. The patient is awake, alert and oriented x 3. Insight, judgment intact.    LABORATORY PANEL:   CBC  Recent Labs Lab 12/11/14 2218  WBC 7.9  HGB 11.8*  HCT 37.2  PLT 236   ------------------------------------------------------------------------------------------------------------------  Chemistries   Recent Labs Lab 12/11/14 2218  NA 141  K 4.2  CL 107  CO2 26  GLUCOSE 104*  BUN 7  CREATININE 0.69  CALCIUM 9.0  AST 489*  ALT 518*  ALKPHOS 89  BILITOT 3.0*   ------------------------------------------------------------------------------------------------------------------  Cardiac Enzymes  Recent Labs Lab 12/11/14 0050  TROPONINI <0.03   ------------------------------------------------------------------------------------------------------------------  RADIOLOGY:  US Abdomen Limited Ruq  12/12/2014   CLINICAL DATA:  Acute onset of right upper quadrant abdominal pain. Initial encounter.  EXAM: US ABDOMEN LIMITED - RIGHT UPPER QUADRANT  COMPARISON:  None.  FINDINGS: Gallbladder:  Scattered small stones are seen dependently within the gallbladder. The gallbladder is otherwise unremarkable. No gallbladder wall thickening or pericholecystic fluid is seen. However, a positive ultrasonographic Murphy's sign is elicited.  Common bile duct:  Diameter: 0.8 cm, dilated in appearance.  Liver:  No focal lesion identified. Within normal limits in parenchymal echogenicity. Mild intrahepatic biliary ductal dilatation is seen.  IMPRESSION: Dilatation of the common bile duct and mild intrahepatic biliary ductal dilatation suggests distal common bile duct  obstruction due to a stone. Cholelithiasis noted. Positive ultrasonographic Murphy's sign likely also reflects obstruction. No evidence for cholecystitis.   Electronically Signed   By: Garald Balding M.D.   On: 12/12/2014 00:28    EKG:   Orders placed or performed during the hospital encounter of 12/10/14  . EKG    IMPRESSION AND PLAN:   39 year old female history of gastric sleeve presenting for abdominal pain.  1. Choledocholithiasis without cholecystitis: Provide pain medication as required, Zofran as required, consult gastroenterology for possible ERCP 2. UTI: Received Cipro emergency department continue antibiotics coverage 3. Venous thromboembolism prophylactic: SCDs    All the records are reviewed and case discussed with ED provider. Management plans discussed with the patient, family and they are in agreement.  CODE STATUS: Full  TOTAL TIME TAKING CARE OF THIS PATIENT: 35 minutes.    Hower,  Karenann Cai.D on 12/12/2014 at 2:26 AM  Between 7am to 6pm - Pager - [phone]  After 6pm: House Pager: [phone]  [facility]  Office  [phone]  CC: Primary care physician; [facility], MD

## 2014-12-12 NOTE — Progress Notes (Signed)
ANTIBIOTIC CONSULT NOTE - INITIAL  Pharmacy Consult for Ciprofloxacin Indication: UTI  No Known Allergies  Patient Measurements: Height: 5\' 5"  (165.1 cm) Weight: 152 lb (68.947 kg) IBW/kg (Calculated) : 57 Adjusted Body Weight:   Vital Signs: Temp: 98.1 F (36.7 C) (07/02 0743) Temp Source: Oral (07/02 0743) BP: 116/82 mmHg (07/02 0743) Pulse Rate: 58 (07/02 0743) Intake/Output from previous day:   Intake/Output from this shift: Total I/O In: 62.2 [I.V.:62.2] Out: 0   Labs:  Recent Labs  12/11/14 0050 12/11/14 2218 12/12/14 0411  WBC 11.5* 7.9 5.6  HGB 11.8* 11.8* 11.1*  PLT 229 236 196  CREATININE 0.88 0.69 0.76   Estimated Creatinine Clearance: 93 mL/min (by C-G formula based on Cr of 0.76). No results for input(s): VANCOTROUGH, VANCOPEAK, VANCORANDOM, GENTTROUGH, GENTPEAK, GENTRANDOM, TOBRATROUGH, TOBRAPEAK, TOBRARND, AMIKACINPEAK, AMIKACINTROU, AMIKACIN in the last 72 hours.   Microbiology: No results found for this or any previous visit (from the past 720 hour(s)).  Medical History: Past Medical History  Diagnosis Date  . Hyperlipidemia   . Hypertension   . Hyperglycemia   . Hypothyroidism   . Thyroid mass   . Obesity     Medications:  Prescriptions prior to admission  Medication Sig Dispense Refill Last Dose  . dexlansoprazole (DEXILANT) 60 MG capsule Take 60 mg by mouth daily.      Assessment: Pt with UTI Cipro 400 mg IV X 1 given in ED on 7/1 @ 2:00.  Goal of Therapy:  resolution of infection  Plan:  Expected duration 7 days with resolution of temperature and/or normalization of WBC  Will continue this pt on Ciprofloxacin 400 mg IV Q12H to start 7/2 @ 14:00.   Chaun Uemura D 12/12/2014,10:07 AM

## 2014-12-12 NOTE — Care Management Note (Signed)
Case Management Note  Patient Details  Name: [name] MRN: [id number] Date of Birth: January 26, 1976  Subjective/Objective:           Transported to [facility] via [facility].         Action/Plan:   Expected Discharge Date:                  Expected Discharge Plan:     In-House Referral:     Discharge planning Services     Post Acute Care Choice:    Choice offered to:     DME Arranged:    DME Agency:     HH Arranged:    [place] Agency:     Status of Service:     Medicare Important Message Given:    Date Medicare IM Given:    Medicare IM give by:    Date Additional Medicare IM Given:    Additional Medicare Important Message give by:     If discussed at [place] of Stay Meetings, dates discussed:    Additional Comments:  Hector Venne A, RN 12/12/2014, 1:43 PM

## 2014-12-12 NOTE — Progress Notes (Signed)
Discussed with Dr. Volanda Napoleon; who wants to transfer patient to [facility] due to choledocholithiasis and need for ERCP. No GI at [facility]. GI (Dr. Benson Norway) was contacted and they will see patient in consultation for potential ERCP on 7/3 (please let them know when patient arrived).     Barton Dubois [phone]

## 2014-12-12 NOTE — Consult Note (Signed)
GI Inpatient Consult Note Lollie Sails MD  Reason for Consult: Choledocholithiasis   Attending Requesting Consult: Dr. Myrtis Ser  Outpatient Primary Physician: Dr. Rosario Jacks  History of Present Illness: [name] is a 39 y.o. female who was admitted to the hospital here at [facility] late last night. She presented to hospital with eating and pain in the epigastric region around toward her back. On the right side. He has been 78-10 in intensity increased with deep breathing or coughing.  Patient has a history of a gastric sleeve done in September 2015. Subsequent to that she has lost about 115 pounds.  He has had 2 sets of liver associated enzymes the showing an increase of AST and ALT from 1022 424 and 59 up to 2 since hospitalization. She also had a normal bilirubin when first checked and is now 3.0-3.1. He had a ultrasound showing common bile duct stones as well as stones in the gallbladder.  Past Medical History:  Past Medical History  Diagnosis Date  . Hyperlipidemia   . Hypertension   . Hyperglycemia   . Hypothyroidism   . Thyroid mass   . Obesity     Problem List: Patient Active Problem List   Diagnosis Date Noted  . Choledocholithiasis 12/12/2014    Past Surgical History: Past Surgical History  Procedure Laterality Date  . Partial hysterectomy    . Cesarean section    . Sleeve gastroplasty      Allergies: No Known Allergies  Home Medications: Prescriptions prior to admission  Medication Sig Dispense Refill Last Dose  . dexlansoprazole (DEXILANT) 60 MG capsule Take 60 mg by mouth daily.      Home medication reconciliation was completed with the patient.   Scheduled Inpatient Medications:   . ciprofloxacin  400 mg Intravenous Q12H    Continuous Inpatient Infusions:   . sodium chloride 100 mL/hr at 12/12/14 0422  . sodium chloride      PRN Inpatient Medications:  morphine injection, ondansetron **OR** ondansetron  (ZOFRAN) IV, oxyCODONE  Family History: family history includes Heart attack (age of onset: 97) in her paternal aunt; Heart attack (age of onset: 70) in her paternal aunt; Hyperlipidemia in her father and mother; Hypertension in her father and mother.   GI Family History:   Social History:   reports that she has never smoked. She does not have any smokeless tobacco history on file. She reports that she drinks alcohol. She reports that she does not use illicit drugs. The patient denies ETOH, tobacco, or drug use.   ROS  Review of Systems: Per admission history and physical agree with same  Physical Examination: BP 116/82 mmHg  Pulse 58  Temp(Src) 98.1 F (36.7 C) (Oral)  Resp 16  Ht 5\' 5"  (1.651 m)  Wt 68.947 kg (152 lb)  BMI 25.29 kg/m2  SpO2 69% Gen: 39 year old female no acute distress obvious discomfort. HEENT: Normocephalic atraumatic eyes show mild icterus. Neck: Supple no JVD no lymphadenopathy no thyromegaly Chest: Clear to auscultation CV: Regular rate and rhythm rub murmur or gallop Abd: soft, no distention sounds are positive normoactive hyperactive. sHe is exquisitely tender in the right upper quadrant toward the mid epigastric region. There are no masses or rebound. Tenderness also extends downward from the right upper quadrant toward the upper region of the right lower quadrant. Ext: Clubbing cyanosis or edema Skin: Warm dry Other:  Data: Lab Results  Component Value Date   WBC 5.6 12/12/2014   HGB 11.1*  2015-01-11   HCT 34.2* January 11, 2015   MCV 82.9 01-11-15   PLT 196 01/11/2015    Recent Labs Lab 12/11/14 0050 12/11/14 2218 11-Jan-2015 0411  HGB 11.8* 11.8* 11.1*   Lab Results  Component Value Date   NA 141 2015/01/11   K 3.7 11-Jan-2015   CL 108 01/11/2015   CO2 28 01-11-2015   BUN 7 2015/01/11   CREATININE 0.76 January 11, 2015   Lab Results  Component Value Date   ALT 502* January 11, 2015   AST 424* 2015-01-11   ALKPHOS 89 2015/01/11   BILITOT 3.1*  2015/01/11   No results for input(s): APTT, INR, PTT in the last 168 hours. CBC Latest Ref Rng Jan 11, 2015 12/11/2014 12/11/2014  WBC 3.6 - 11.0 K/uL 5.6 7.9 11.5(H)  Hemoglobin 12.0 - 16.0 g/dL 11.1(L) 11.8(L) 11.8(L)  Hematocrit 35.0 - 47.0 % 34.2(L) 37.2 37.3  Platelets 150 - 440 K/uL 196 236 229    STUDIES: US Abdomen Limited Ruq  2015/01/11   CLINICAL DATA:  Acute onset of right upper quadrant abdominal pain. Initial encounter.  EXAM: US ABDOMEN LIMITED - RIGHT UPPER QUADRANT  COMPARISON:  None.  FINDINGS: Gallbladder:  Scattered small stones are seen dependently within the gallbladder. The gallbladder is otherwise unremarkable. No gallbladder wall thickening or pericholecystic fluid is seen. However, a positive ultrasonographic Murphy's sign is elicited.  Common bile duct:  Diameter: 0.8 cm, dilated in appearance.  Liver:  No focal lesion identified. Within normal limits in parenchymal echogenicity. Mild intrahepatic biliary ductal dilatation is seen.  IMPRESSION: Dilatation of the common bile duct and mild intrahepatic biliary ductal dilatation suggests distal common bile duct obstruction due to a stone. Cholelithiasis noted. Positive ultrasonographic Murphy's sign likely also reflects obstruction. No evidence for cholecystitis.   Electronically Signed   By: Garald Balding M.D.   On: 01/11/15 00:28   @IMAGES @  Assessment: 1. Acute cholecystitis possible choledocholithiasis.  Recommendations: 1. There is no availability for CP for the next several days at [facility]. Consideration of transfer 4 ERCP and consideration of cystectomy. Discussed with Dr. Volanda Napoleon  Thank you for the consult. Please call with questions or concerns.  Lollie Sails, MD  January 11, 2015 1:38 PM

## 2014-12-12 NOTE — Consult Note (Signed)
Reason for Consult: Choledocholithiasis Referring Physician: [facility]  [place] [name] HPI: This is a 39 year old female with a PMH of Sleeve gastroplasty, HTN, hyperlipidemia, obesity, and hypothyroidism admitted for RUQ pain with radiation to the right shoulder.  Further evaluation in the ER revealed that she has elevated liver enzymes and a TB in the 3 range.  The RUQ U/S measured the CBD at 8 mm, but there is some mild intrahepatic biliary ductal dilation.  [facility] does not have Gi coverage this weekend and she is being transferred to [facility] for further evaluation and treatment.  There is no evidence of cholecystitis with the ultrasound.  Past Medical History  Diagnosis Date  . Hyperlipidemia   . Hypertension   . Hyperglycemia   . Hypothyroidism   . Thyroid mass   . Obesity     Past Surgical History  Procedure Laterality Date  . Partial hysterectomy    . Cesarean section    . Sleeve gastroplasty      Family History  Problem Relation Age of Onset  . Hypertension Mother   . Hyperlipidemia Mother   . Hypertension Father   . Hyperlipidemia Father   . Heart attack Paternal Aunt 73  . Heart attack Paternal Aunt 64    Social History:  reports that she has never smoked. She does not have any smokeless tobacco history on file. She reports that she drinks alcohol. She reports that she does not use illicit drugs.  Allergies: No Known Allergies  Medications:  Scheduled: . ciprofloxacin  400 mg Intravenous Q12H   Continuous: . sodium chloride 100 mL/hr at 12/12/14 0422    Results for orders placed or performed during the hospital encounter of 12/11/14 (from the past 24 hour(s))  CBC WITH DIFFERENTIAL     Status: Abnormal   Collection Time: 12/11/14 10:18 PM  Result Value Ref Range   WBC 7.9 3.6 - 11.0 K/uL   RBC 4.50 3.80 - 5.20 MIL/uL   Hemoglobin 11.8 (L) 12.0 - 16.0 g/dL   HCT 37.2 35.0 - 47.0 %   MCV 82.7 80.0 - 100.0 fL    MCH 26.3 26.0 - 34.0 pg   MCHC 31.8 (L) 32.0 - 36.0 g/dL   RDW 13.1 11.5 - 14.5 %   Platelets 236 150 - 440 K/uL   Neutrophils Relative % 80 %   Neutro Abs 6.4 1.4 - 6.5 K/uL   Lymphocytes Relative 11 %   Lymphs Abs 0.8 (L) 1.0 - 3.6 K/uL   Monocytes Relative 6 %   Monocytes Absolute 0.5 0.2 - 0.9 K/uL   Eosinophils Relative 1 %   Eosinophils Absolute 0.0 0 - 0.7 K/uL   Basophils Relative 2 %   Basophils Absolute 0.1 0 - 0.1 K/uL  Comprehensive metabolic panel     Status: Abnormal   Collection Time: 12/11/14 10:18 PM  Result Value Ref Range   Sodium 141 135 - 145 mmol/L   Potassium 4.2 3.5 - 5.1 mmol/L   Chloride 107 101 - 111 mmol/L   CO2 26 22 - 32 mmol/L   Glucose, Bld 104 (H) 65 - 99 mg/dL   BUN 7 6 - 20 mg/dL   Creatinine, Ser 0.69 0.44 - 1.00 mg/dL   Calcium 9.0 8.9 - 10.3 mg/dL   Total Protein 6.8 6.5 - 8.1 g/dL   Albumin 3.9 3.5 - 5.0 g/dL   AST 489 (H) 15 - 41 U/L   ALT 518 (H) 14 -  54 U/L   Alkaline Phosphatase 89 38 - 126 U/L   Total Bilirubin 3.0 (H) 0.3 - 1.2 mg/dL   GFR calc non Af Amer >60 >60 mL/min   GFR calc Af Amer >60 >60 mL/min   Anion gap 8 5 - 15  Urinalysis complete, with microscopic ([facility] only)     Status: Abnormal   Collection Time: 12/11/14 10:18 PM  Result Value Ref Range   Color, Urine AMBER (A) YELLOW   APPearance CLEAR (A) CLEAR   Glucose, UA NEGATIVE NEGATIVE mg/dL   Bilirubin Urine 1+ (A) NEGATIVE   Ketones, ur 1+ (A) NEGATIVE mg/dL   Specific Gravity, Urine 1.023 1.005 - 1.030   Hgb urine dipstick NEGATIVE NEGATIVE   pH 5.0 5.0 - 8.0   Protein, ur 30 (A) NEGATIVE mg/dL   Nitrite POSITIVE (A) NEGATIVE   Leukocytes, UA 2+ (A) NEGATIVE   RBC / HPF 0-5 0 - 5 RBC/hpf   WBC, UA TOO NUMEROUS TO COUNT 0 - 5 WBC/hpf   Bacteria, UA FEW (A) NONE SEEN   Squamous Epithelial / LPF 0-5 (A) NONE SEEN   Mucous PRESENT   Comprehensive metabolic panel     Status: Abnormal   Collection Time: 12/12/14  4:11 AM  Result Value Ref Range   Sodium  141 135 - 145 mmol/L   Potassium 3.7 3.5 - 5.1 mmol/L   Chloride 108 101 - 111 mmol/L   CO2 28 22 - 32 mmol/L   Glucose, Bld 90 65 - 99 mg/dL   BUN 7 6 - 20 mg/dL   Creatinine, Ser 0.76 0.44 - 1.00 mg/dL   Calcium 8.6 (L) 8.9 - 10.3 mg/dL   Total Protein 6.1 (L) 6.5 - 8.1 g/dL   Albumin 3.4 (L) 3.5 - 5.0 g/dL   AST 424 (H) 15 - 41 U/L   ALT 502 (H) 14 - 54 U/L   Alkaline Phosphatase 89 38 - 126 U/L   Total Bilirubin 3.1 (H) 0.3 - 1.2 mg/dL   GFR calc non Af Amer >60 >60 mL/min   GFR calc Af Amer >60 >60 mL/min   Anion gap 5 5 - 15  CBC     Status: Abnormal   Collection Time: 12/12/14  4:11 AM  Result Value Ref Range   WBC 5.6 3.6 - 11.0 K/uL   RBC 4.12 3.80 - 5.20 MIL/uL   Hemoglobin 11.1 (L) 12.0 - 16.0 g/dL   HCT 34.2 (L) 35.0 - 47.0 %   MCV 82.9 80.0 - 100.0 fL   MCH 26.9 26.0 - 34.0 pg   MCHC 32.4 32.0 - 36.0 g/dL   RDW 13.1 11.5 - 14.5 %   Platelets 196 150 - 440 K/uL     US Abdomen Limited Ruq  12/12/2014   CLINICAL DATA:  Acute onset of right upper quadrant abdominal pain. Initial encounter.  EXAM: US ABDOMEN LIMITED - RIGHT UPPER QUADRANT  COMPARISON:  None.  FINDINGS: Gallbladder:  Scattered small stones are seen dependently within the gallbladder. The gallbladder is otherwise unremarkable. No gallbladder wall thickening or pericholecystic fluid is seen. However, a positive ultrasonographic Murphy's sign is elicited.  Common bile duct:  Diameter: 0.8 cm, dilated in appearance.  Liver:  No focal lesion identified. Within normal limits in parenchymal echogenicity. Mild intrahepatic biliary ductal dilatation is seen.  IMPRESSION: Dilatation of the common bile duct and mild intrahepatic biliary ductal dilatation suggests distal common bile duct obstruction due to a stone. Cholelithiasis noted. Positive ultrasonographic Murphy's sign  likely also reflects obstruction. No evidence for cholecystitis.   Electronically Signed   By: Garald Balding M.D.   On: 12/12/2014 00:28    ROS:   As stated above in the HPI otherwise negative.  Blood pressure 116/82, pulse 58, temperature 98.1 F (36.7 C), temperature source Oral, resp. rate 16, height 5\' 5"  (1.651 m), weight 68.947 kg (152 lb), SpO2 100 %.    PE: Gen: NAD, Alert and Oriented HEENT:  [place]/AT, EOMI Neck: Supple, no LAD Lungs: CTA Bilaterally CV: RRR without M/G/R ABM: Soft, NTND, +BS Ext: No C/C/E  Assessment/Plan: 1) Choledocholithiasis. 2) Cholelithiasis without cholecystitis.   It appears she needs an ERCP with stone extraction.  She will also need to undergo a lap chole after the ERCP in the near future.  I discussed the risks of bleeding, infection, perforation, and pancreatitis with the patient.  She acknowledges the risks and wishes to proceed.  Plan: 1) ERCP tomorrow with anesthesia. 2) Prophylactic antibiotics. 3) Pain control. 4) Surgical consultation.  Alen Matheson D 12/12/2014, 12:01 PM

## 2014-12-13 ENCOUNTER — Inpatient Hospital Stay (HOSPITAL_COMMUNITY): Payer: Commercial Managed Care - HMO | Admitting: Anesthesiology

## 2014-12-13 ENCOUNTER — Inpatient Hospital Stay (HOSPITAL_COMMUNITY): Payer: Commercial Managed Care - HMO

## 2014-12-13 ENCOUNTER — Encounter (HOSPITAL_COMMUNITY)
Admission: AD | Disposition: A | Discharge status: Home / Self Care | Payer: Self-pay | Source: Other Acute Inpatient Hospital | Attending: Internal Medicine

## 2014-12-13 ENCOUNTER — Encounter (HOSPITAL_COMMUNITY): Payer: Self-pay | Admitting: Anesthesiology

## 2014-12-13 DIAGNOSIS — K8071 Calculus of gallbladder and bile duct without cholecystitis with obstruction: Secondary | ICD-10-CM

## 2014-12-13 DIAGNOSIS — N3000 Acute cystitis without hematuria: Secondary | ICD-10-CM

## 2014-12-13 HISTORY — PX: ERCP: SHX5425

## 2014-12-13 LAB — CBC
HCT: 34.2 % — ABNORMAL LOW (ref 36.0–46.0)
Hemoglobin: 10.8 g/dL — ABNORMAL LOW (ref 12.0–15.0)
MCH: 25.9 pg — AB (ref 26.0–34.0)
MCHC: 31.6 g/dL (ref 30.0–36.0)
MCV: 82 fL (ref 78.0–100.0)
Platelets: 231 10*3/uL (ref 150–400)
RBC: 4.17 MIL/uL (ref 3.87–5.11)
RDW: 12.9 % (ref 11.5–15.5)
WBC: 4.6 10*3/uL (ref 4.0–10.5)

## 2014-12-13 LAB — COMPREHENSIVE METABOLIC PANEL
ALBUMIN: 2.9 g/dL — AB (ref 3.5–5.0)
ALT: 335 U/L — ABNORMAL HIGH (ref 14–54)
AST: 123 U/L — AB (ref 15–41)
Alkaline Phosphatase: 90 U/L (ref 38–126)
Anion gap: 9 (ref 5–15)
CO2: 24 mmol/L (ref 22–32)
Calcium: 8.3 mg/dL — ABNORMAL LOW (ref 8.9–10.3)
Chloride: 107 mmol/L (ref 101–111)
Creatinine, Ser: 0.89 mg/dL (ref 0.44–1.00)
GFR calc non Af Amer: >60 mL/min (ref >60)
Glucose, Bld: 71 mg/dL (ref 65–99)
Potassium: 3.7 mmol/L (ref 3.5–5.1)
SODIUM: 140 mmol/L (ref 135–145)
Total Bilirubin: 1.1 mg/dL (ref 0.3–1.2)
Total Protein: 5 g/dL — ABNORMAL LOW (ref 6.5–8.1)

## 2014-12-13 LAB — SURGICAL PCR SCREEN
MRSA, PCR: NEGATIVE
STAPHYLOCOCCUS AUREUS: NEGATIVE

## 2014-12-13 SURGERY — ERCP, WITH INTERVENTION IF INDICATED
Anesthesia: General

## 2014-12-13 SURGERY — ERCP, WITH INTERVENTION IF INDICATED
Anesthesia: Monitor Anesthesia Care

## 2014-12-13 MED ORDER — SUCCINYLCHOLINE CHLORIDE 20 MG/ML IJ SOLN
INTRAMUSCULAR | Status: DC | PRN
  Administered 2014-12-13: 100 mg via INTRAVENOUS

## 2014-12-13 MED ORDER — SODIUM CHLORIDE 0.9 % IV SOLN
INTRAVENOUS | Status: DC | PRN
  Administered 2014-12-13: 25 mL

## 2014-12-13 MED ORDER — FENTANYL CITRATE (PF) 250 MCG/5ML IJ SOLN
INTRAMUSCULAR | Status: AC
  Filled 2014-12-13: qty 5

## 2014-12-13 MED ORDER — ONDANSETRON HCL 4 MG/2ML IJ SOLN
INTRAMUSCULAR | Status: AC
  Filled 2014-12-13: qty 2

## 2014-12-13 MED ORDER — LIDOCAINE HCL (CARDIAC) 20 MG/ML IV SOLN
INTRAVENOUS | Status: DC | PRN
  Administered 2014-12-13: 60 mg via INTRAVENOUS

## 2014-12-13 MED ORDER — FENTANYL CITRATE (PF) 100 MCG/2ML IJ SOLN
INTRAMUSCULAR | Status: DC | PRN
Start: 2014-12-13 — End: 2014-12-13
  Administered 2014-12-13 (×2): 50 ug via INTRAVENOUS
  Administered 2014-12-13: 150 ug via INTRAVENOUS

## 2014-12-13 MED ORDER — LACTATED RINGERS IV SOLN
INTRAVENOUS | Status: DC | PRN
  Administered 2014-12-13: 10:00:00 via INTRAVENOUS

## 2014-12-13 MED ORDER — PROPOFOL 10 MG/ML IV BOLUS
INTRAVENOUS | Status: DC | PRN
  Administered 2014-12-13: 150 mg via INTRAVENOUS

## 2014-12-13 MED ORDER — PHENYLEPHRINE HCL 10 MG/ML IJ SOLN
INTRAMUSCULAR | Status: DC | PRN
  Administered 2014-12-13 (×2): 80 ug via INTRAVENOUS

## 2014-12-13 MED ORDER — ONDANSETRON HCL 4 MG/2ML IJ SOLN
INTRAMUSCULAR | Status: DC | PRN
  Administered 2014-12-13: 4 mg via INTRAVENOUS

## 2014-12-13 MED ORDER — INDOMETHACIN 25 MG SUPPOSITORY
50.0000 mg | RECTAL | Status: AC
  Administered 2014-12-13: 50 mg via RECTAL
  Filled 2014-12-13: qty 2

## 2014-12-13 MED ORDER — SODIUM CHLORIDE 0.9 % IV SOLN: INTRAVENOUS | Status: DC

## 2014-12-13 MED ORDER — LIDOCAINE HCL (CARDIAC) 20 MG/ML IV SOLN
INTRAVENOUS | Status: AC
  Filled 2014-12-13: qty 5

## 2014-12-13 MED ORDER — PROMETHAZINE HCL 25 MG/ML IJ SOLN: 6.2500 mg | INTRAMUSCULAR | Status: DC | PRN

## 2014-12-13 MED ORDER — MIDAZOLAM HCL 2 MG/2ML IJ SOLN
INTRAMUSCULAR | Status: AC
  Filled 2014-12-13: qty 2

## 2014-12-13 MED ORDER — MIDAZOLAM HCL 5 MG/5ML IJ SOLN
INTRAMUSCULAR | Status: DC | PRN
  Administered 2014-12-13: 1 mg via INTRAVENOUS

## 2014-12-13 MED ORDER — HYDROMORPHONE HCL 1 MG/ML IJ SOLN: 0.2500 mg | INTRAMUSCULAR | Status: DC | PRN

## 2014-12-13 MED ORDER — PROPOFOL 10 MG/ML IV BOLUS
INTRAVENOUS | Status: AC
  Filled 2014-12-13: qty 20

## 2014-12-13 NOTE — Progress Notes (Signed)
Utilization Review Completed.[name] T7/08/2014  

## 2014-12-13 NOTE — Progress Notes (Signed)
PROGRESS NOTE    [name] ZOX:[id number] DOB: 04-28-1976 DOA: 12/12/2014 PCP: Casilda Carls, MD  HPI/Brief narrative 39 year old female with history of HLD, HTN, hypothyroid, obesity status post sleeve gastropathy, presented to [facility] on 12/20/14 with abdominal pain, nausea and abnormal LFTs. Ultrasound abdomen showed choledocholithiasis without cholecystitis. UA suggestive of UTI and patient was started on Cipro. Since [facility] does not have GI coverage this weekend, she was transferred to [facility] for evaluation by GI.   Assessment/Plan:  Cholelithiasis without cholecystitis/choledocholithiasis - Treated supportively with bowel rest, IV fluids and pain management. - GI consulted and patient underwent ERCP with stone extraction on 7/3 - Dr. Benson Norway has consulted general surgery for laparoscopic cholecystectomy  Presumed UTI - Continue Cipro. Unfortunately no urine cultures were sent on admission  Essential hypertension - Controlled  Hypothyroid  Anemia - Stable  DVT prophylaxis: Heparin Code Status: Full Family Communication: Discussed with patient's mother at bedside on 7/3 Disposition Plan: DC home when medically stable, possibly 11/5   Consultants:  GI-Dr. [place] surgery  Procedures:  ERCP 7/3  Antibiotics:  Cipro 7/2 >   Subjective: Seen this morning prior to ERCP. Mild abdominal pain. No nausea or vomiting.  Objective: Filed Vitals:   12/13/14 1200 12/13/14 1215 12/13/14 1230 12/13/14 1240  BP: 136/83 134/79  134/87  Pulse: 81 62 65 63  Temp:  97.6 F (36.4 C)  97.7 F (36.5 C)  TempSrc:    Oral  Resp: 20 14 19 16   Height:      Weight:      SpO2: 100% 100% 100% 100%    Intake/Output Summary (Last 24 hours) at 12/13/14 1350 Last data filed at 12/13/14 1200  Gross per 24 hour  Intake 1992.5 ml  Output    500 ml  Net 1492.5 ml   Filed Weights   12/12/14 1433 12/13/14 1010  Weight: 71.1 kg (156 lb  12 oz) 70.761 kg (156 lb)     Exam:  General exam: Pleasant young female lying comfortably in bed. Respiratory system: Clear. No increased work of breathing. Cardiovascular system: S1 & S2 heard, RRR. No JVD, murmurs, gallops, clicks or pedal edema. Gastrointestinal system: Abdomen is nondistended, soft. Mild RUQ tenderness without peritoneal signs. Normal bowel sounds heard. Central nervous system: Alert and oriented. No focal neurological deficits. Extremities: Symmetric 5 x 5 power.   Data Reviewed: Basic Metabolic Panel:  Recent Labs Lab 12/11/14 0050 12/11/14 2218 12/12/14 0411 12/12/14 1855 12/13/14 0345  NA 142 141 141  --  140  K 3.2* 4.2 3.7  --  3.7  CL 107 107 108  --  107  CO2 26 26 28   --  24  GLUCOSE 120* 104* 90  --  71  BUN 11 7 7   --  <5*  CREATININE 0.88 0.69 0.76 1.01* 0.89  CALCIUM 8.8* 9.0 8.6*  --  8.3*   Liver Function Tests:  Recent Labs Lab 12/11/14 0050 12/11/14 2218 12/12/14 0411 12/13/14 0345  AST 102* 489* 424* 123*  ALT 59* 518* 502* 335*  ALKPHOS 46 89 89 90  BILITOT 0.5 3.0* 3.1* 1.1  PROT 6.5 6.8 6.1* 5.0*  ALBUMIN 3.8 3.9 3.4* 2.9*   No results for input(s): LIPASE, AMYLASE in the last 168 hours. No results for input(s): AMMONIA in the last 168 hours. CBC:  Recent Labs Lab 12/11/14 0050 12/11/14 2218 12/12/14 0411 12/12/14 1855 12/13/14 0345  WBC 11.5* 7.9 5.6 5.5 4.6  NEUTROABS  --  6.4  --   --   --   HGB 11.8* 11.8* 11.1* 11.3* 10.8*  HCT 37.3 37.2 34.2* 35.5* 34.2*  MCV 82.8 82.7 82.9 82.2 82.0  PLT 229 236 196 227 231   Cardiac Enzymes:  Recent Labs Lab 12/11/14 0050  CKTOTAL 76  TROPONINI <0.03   BNP (last 3 results) No results for input(s): PROBNP in the last 8760 hours. CBG: No results for input(s): GLUCAP in the last 168 hours.  No results found for this or any previous visit (from the past 240 hour(s)).       Studies: Dg Ercp Biliary & Pancreatic Ducts  12/13/2014   CLINICAL DATA:   Biliary obstructions suggested on ultrasound. Right upper quadrant pain.  EXAM: ERCP  TECHNIQUE: Multiple spot images obtained with the fluoroscopic device and submitted for interpretation post-procedure.  FLUOROSCOPY TIME:  3 minutes 56 seconds  COMPARISON:  Ultrasound 12/11/2014  FINDINGS: Four fluoroscopic spot images document endoscopic cannulation and opacification of the CBD. Intrahepatic ducts are incompletely opacified, appearing decompressed centrally. There is partial opacification of the gallbladder. Passage of a balloon tipped catheter in the distal CBD is documented. No extravasation seen.  IMPRESSION: 1. Endoscopic balloon sweep of common duct. These images were submitted for radiologic interpretation only. Please see the procedural report for the amount of contrast and the fluoroscopy time utilized.   Electronically Signed   By: Lucrezia Europe M.D.   On: 12/13/2014 11:33   US Abdomen Limited Ruq  12/12/2014   CLINICAL DATA:  Acute onset of right upper quadrant abdominal pain. Initial encounter.  EXAM: US ABDOMEN LIMITED - RIGHT UPPER QUADRANT  COMPARISON:  None.  FINDINGS: Gallbladder:  Scattered small stones are seen dependently within the gallbladder. The gallbladder is otherwise unremarkable. No gallbladder wall thickening or pericholecystic fluid is seen. However, a positive ultrasonographic Murphy's sign is elicited.  Common bile duct:  Diameter: 0.8 cm, dilated in appearance.  Liver:  No focal lesion identified. Within normal limits in parenchymal echogenicity. Mild intrahepatic biliary ductal dilatation is seen.  IMPRESSION: Dilatation of the common bile duct and mild intrahepatic biliary ductal dilatation suggests distal common bile duct obstruction due to a stone. Cholelithiasis noted. Positive ultrasonographic Murphy's sign likely also reflects obstruction. No evidence for cholecystitis.   Electronically Signed   By: Garald Balding M.D.   On: 12/12/2014 00:28        Scheduled Meds: .  ciprofloxacin  400 mg Intravenous Q12H  . heparin  5,000 Units Subcutaneous 3 times per day   Continuous Infusions: . sodium chloride 75 mL/hr at 12/13/14 1255    Active Problems:   Choledocholithiasis   UTI (urinary tract infection)    Time spent: 49 minutes    HONGALGI,ANAND, MD, FACP, FHM. [facility] Pager [phone]  If 7PM-7AM, please contact night-coverage [email or web address] Password [email or web address] 12/13/2014, 1:50 PM    LOS: 1 day

## 2014-12-13 NOTE — Anesthesia Procedure Notes (Signed)
Procedure Name: Intubation Date/Time: 12/13/2014 10:16 AM Performed by: Rebekah Chesterfield L Pre-anesthesia Checklist: Patient identified, Emergency Drugs available, Suction available, Patient being monitored and Timeout performed Patient Re-evaluated:Patient Re-evaluated prior to inductionOxygen Delivery Method: Circle system utilized Preoxygenation: Pre-oxygenation with 100% oxygen Intubation Type: IV induction Ventilation: Mask ventilation without difficulty Laryngoscope Size: Mac and 3 Grade View: Grade I Tube type: Oral Tube size: 7.5 mm Number of attempts: 1 Airway Equipment and Method: Stylet Placement Confirmation: ETT inserted through vocal cords under direct vision,  positive ETCO2 and breath sounds checked- equal and bilateral Secured at: 20 cm Tube secured with: Tape Dental Injury: Teeth and Oropharynx as per pre-operative assessment

## 2014-12-13 NOTE — Anesthesia Postprocedure Evaluation (Signed)
  Anesthesia Post-op Note  Patient: [name]  Procedure(s) Performed: Procedure(s): ENDOSCOPIC RETROGRADE CHOLANGIOPANCREATOGRAPHY (ERCP) (N/A)  Patient Location: PACU  Anesthesia Type:General  Level of Consciousness: awake and alert   Airway and Oxygen Therapy: Patient Spontanous Breathing  Post-op Pain: none  Post-op Assessment: Post-op Vital signs reviewed              Post-op Vital Signs: stable  Last Vitals:  Filed Vitals:   12/13/14 1240  BP: 134/87  Pulse: 63  Temp: 36.5 C  Resp: 16    Complications: No apparent anesthesia complications

## 2014-12-13 NOTE — Progress Notes (Signed)
PT Cancellation Note  Patient Details Name: [name] MRN: [id number] DOB: Aug 11, 1975   Cancelled Treatment:    Reason Eval/Treat Not Completed: Patient not medically ready -  PT order written 7/2, now pt is POD#0 (7/3)  for ERCP with stone removal.  Will need new PT order for mobility evaluation and d/c recommendations if indicated.  Thank you.     Kearney Hard [facility] 12/13/2014, 2:02 PM

## 2014-12-13 NOTE — Op Note (Signed)
[facility] [place], [address]   ERCP PROCEDURE REPORT        EXAM DATE: 12/13/2014  PATIENT NAME:          [name]          MR #: [id number] BIRTHDATE:       02-Jan-1976     VISIT #:     [phone] ATTENDING:     Carol Ada, MD     STATUS:     outpatient ASSISTANT:      Sharlene Dory, and Dustin Flock   INDICATIONS:  The patient is a 39 yr old female here for an ERCP due to choledocholithiasis. PROCEDURE PERFORMED:     ERCP with stone extraction  MEDICATIONS:     General Anesthesia  CONSENT: The patient understands the risks and benefits of the procedure and understands that these risks include, but are not limited to: sedation, allergic reaction, infection, perforation and/or bleeding. Alternative means of evaluation and treatment include, among others: physical exam, x-rays, and/or surgical intervention. The patient elects to proceed with this endoscopic procedure.  DESCRIPTION OF PROCEDURE: During intra-op preparation period all mechanical & medical equipment was checked for proper function. Hand hygiene and appropriate measures for infection prevention was taken. After the risks, benefits and alternatives of the procedure were thoroughly explained, Informed was verified, confirmed and timeout was successfully executed by the treatment team. With the patient in left semi-prone position, medications were administered intravenously.The Pentax Ercp Scope [id number] was passed from the mouth into the esophagus and further advanced from the esophagus into the stomach. From stomach scope was directed to the second portion of the duodenum.  Major papilla was aligned with the duodenoscope. The scope position was confirmed fluoroscopically. Rest of the findings/therapeutics are given below. The scope was then completely withdrawn from the patient and the procedure completed. The pulse, BP, and O2  saturation were monitored and documented by the physician and the nursing staff throughout the entire procedure. The patient was cared for as planned according to standard protocol. The patient was then discharged to recovery in stable condition and with appropriate post procedure care. Estimated blood loss is zero unless otherwise noted in this procedure report.  FINDINGS:  The gastric lumen was restricted secondary to the sleeve gastroplasty, but the duodenoscope was able to be advanced into the duodenum was mild difficulty.  The ampulla was located the second portion of the duodenum.  Multiple passes were attempted and the wire went proximally into the left hepatic ducts versus the pancreatic duct.  A slight amount of contrast was injected and it was clear that the wire was in the PD.  Suctioning was performed to quickly remove any contrast from the PD with confirmation on fluoroscopy.  There was also excellent pancreatic juice drainage noted.  Leaving the wire in the PD a secondary wire was employed. After several tries the CBD was cannulated and the wire was secured in the left intrahepatic ducts.  The PD wire was removed.  Contrast injection of the CBD revealed that the CBD was midly dilated at 8-10 mm as well as some mild intrahepatic biliary ductal dilation. No clear evidence of any CBD stones.  She may have passed the stones as her liver enzymes and bilirubin rapidly declined overnight.  Nevertheless,  a 1 cm sphincterotomy was created and the CBD was swept four times.  No stones were extracted.  The final occlusion cholangiogram was negative for  any retained stones.  The procedure was then terminated.  Rectal indomethacin 50 mg was administered after the procedure.    ADVERSE EVENT:     There were no complications. IMPRESSIONS:     1) Midly dilated CBD.  No evidence of any stones.  RECOMMENDATIONS:     1) Surgical consultation for symptomatic gallstones.  (I spoke with  Dr. Donne Hazel.) REPEAT EXAM:   ___________________________________ Carol Ada, MD eSigned:  Carol Ada, MD 12/14/14 11:31 AM   cc:  CPT CODES: ICD9 CODES:  The ICD and CPT codes recommended by this software are interpretations from the data that the clinical staff has captured with the software.  The verification of the translation of this report to the ICD and CPT codes and modifiers is the sole responsibility of the health care institution and practicing physician where this report was generated.  [employer] will not be held responsible for the validity of the ICD and CPT codes included on this report.  AMA assumes no liability for data contained or not contained herein. CPT is a Designer, television/film set of the [employer].   PATIENT NAME:  [name] MR#: [id number]

## 2014-12-13 NOTE — Interval H&P Note (Signed)
History and Physical Interval Note:  12/13/2014 10:12 AM  [name]  has presented today for surgery, with the diagnosis of Choledocholithiasis  The various methods of treatment have been discussed with the patient and family. After consideration of risks, benefits and other options for treatment, the patient has consented to  Procedure(s): ENDOSCOPIC RETROGRADE CHOLANGIOPANCREATOGRAPHY (ERCP) (N/A) as a surgical intervention .  The patient's history has been reviewed, patient examined, no change in status, stable for surgery.  I have reviewed the patient's chart and labs.  Questions were answered to the patient's satisfaction.     Trudie Cervantes D

## 2014-12-13 NOTE — Anesthesia Preprocedure Evaluation (Addendum)
Anesthesia Evaluation  Patient identified by MRN, date of birth, ID band Patient awake    Reviewed: Allergy & Precautions, NPO status , Patient's Chart, lab work & pertinent test results  Airway Mallampati: I       Dental  (+) Teeth Intact   Pulmonary neg pulmonary ROS,  breath sounds clear to auscultation        Cardiovascular hypertension, Rhythm:Regular Rate:Normal     Neuro/Psych negative neurological ROS     GI/Hepatic Hx of gastric sleeve   Endo/Other  Hypothyroidism   Renal/GU      Musculoskeletal negative musculoskeletal ROS (+)   Abdominal   Peds  Hematology negative hematology ROS (+)   Anesthesia Other Findings   Reproductive/Obstetrics                            Anesthesia Physical Anesthesia Plan  ASA: II  Anesthesia Plan: General   Post-op Pain Management:    Induction: Intravenous  Airway Management Planned: Oral ETT  Additional Equipment:   Intra-op Plan:   Post-operative Plan: Extubation in OR  Informed Consent: I have reviewed the patients History and Physical, chart, labs and discussed the procedure including the risks, benefits and alternatives for the proposed anesthesia with the patient or authorized representative who has indicated his/her understanding and acceptance.   Dental advisory given  Plan Discussed with: CRNA and Surgeon  Anesthesia Plan Comments:        Anesthesia Quick Evaluation

## 2014-12-13 NOTE — Transfer of Care (Signed)
Immediate Anesthesia Transfer of Care Note  Patient: [name]  Procedure(s) Performed: Procedure(s): ENDOSCOPIC RETROGRADE CHOLANGIOPANCREATOGRAPHY (ERCP) (N/A)  Patient Location: PACU  Anesthesia Type:General  Level of Consciousness: awake, alert , oriented and patient cooperative  Airway & Oxygen Therapy: Patient Spontanous Breathing and Patient connected to nasal cannula oxygen  Post-op Assessment: Report given to RN, Post -op Vital signs reviewed and stable and Patient moving all extremities  Post vital signs: Reviewed and stable  Last Vitals:  Filed Vitals:   12/13/14 1010  BP: 123/88  Pulse: 122  Temp: 36.4 C  Resp: 20    Complications: No apparent anesthesia complications

## 2014-12-13 NOTE — H&P (View-Only) (Signed)
Reason for Consult: Choledocholithiasis Referring Physician: [facility]  [name] HPI: This is a 39 year old female with a PMH of Sleeve gastroplasty, HTN, hyperlipidemia, obesity, and hypothyroidism admitted for RUQ pain with radiation to the right shoulder.  Further evaluation in the ER revealed that she has elevated liver enzymes and a TB in the 3 range.  The RUQ U/S measured the CBD at 8 mm, but there is some mild intrahepatic biliary ductal dilation.  [facility] does not have Gi coverage this weekend and she is being transferred to [facility] for further evaluation and treatment.  There is no evidence of cholecystitis with the ultrasound.  Past Medical History  Diagnosis Date  . Hyperlipidemia   . Hypertension   . Hyperglycemia   . Hypothyroidism   . Thyroid mass   . Obesity     Past Surgical History  Procedure Laterality Date  . Partial hysterectomy    . Cesarean section    . Sleeve gastroplasty      Family History  Problem Relation Age of Onset  . Hypertension Mother   . Hyperlipidemia Mother   . Hypertension Father   . Hyperlipidemia Father   . Heart attack Paternal Aunt 76  . Heart attack Paternal Aunt 79    Social History:  reports that she has never smoked. She does not have any smokeless tobacco history on file. She reports that she drinks alcohol. She reports that she does not use illicit drugs.  Allergies: No Known Allergies  Medications:  Scheduled: . ciprofloxacin  400 mg Intravenous Q12H   Continuous: . sodium chloride 100 mL/hr at 12/12/14 0422    Results for orders placed or performed during the hospital encounter of 12/11/14 (from the past 24 hour(s))  CBC WITH DIFFERENTIAL     Status: Abnormal   Collection Time: 12/11/14 10:18 PM  Result Value Ref Range   WBC 7.9 3.6 - 11.0 K/uL   RBC 4.50 3.80 - 5.20 MIL/uL   Hemoglobin 11.8 (L) 12.0 - 16.0 g/dL   HCT 37.2 35.0 - 47.0 %   MCV 82.7 80.0 - 100.0 fL    MCH 26.3 26.0 - 34.0 pg   MCHC 31.8 (L) 32.0 - 36.0 g/dL   RDW 13.1 11.5 - 14.5 %   Platelets 236 150 - 440 K/uL   Neutrophils Relative % 80 %   Neutro Abs 6.4 1.4 - 6.5 K/uL   Lymphocytes Relative 11 %   Lymphs Abs 0.8 (L) 1.0 - 3.6 K/uL   Monocytes Relative 6 %   Monocytes Absolute 0.5 0.2 - 0.9 K/uL   Eosinophils Relative 1 %   Eosinophils Absolute 0.0 0 - 0.7 K/uL   Basophils Relative 2 %   Basophils Absolute 0.1 0 - 0.1 K/uL  Comprehensive metabolic panel     Status: Abnormal   Collection Time: 12/11/14 10:18 PM  Result Value Ref Range   Sodium 141 135 - 145 mmol/L   Potassium 4.2 3.5 - 5.1 mmol/L   Chloride 107 101 - 111 mmol/L   CO2 26 22 - 32 mmol/L   Glucose, Bld 104 (H) 65 - 99 mg/dL   BUN 7 6 - 20 mg/dL   Creatinine, Ser 0.69 0.44 - 1.00 mg/dL   Calcium 9.0 8.9 - 10.3 mg/dL   Total Protein 6.8 6.5 - 8.1 g/dL   Albumin 3.9 3.5 - 5.0 g/dL   AST 489 (H) 15 - 41 U/L   ALT 518 (H) 14 -  54 U/L   Alkaline Phosphatase 89 38 - 126 U/L   Total Bilirubin 3.0 (H) 0.3 - 1.2 mg/dL   GFR calc non Af Amer >60 >60 mL/min   GFR calc Af Amer >60 >60 mL/min   Anion gap 8 5 - 15  Urinalysis complete, with microscopic ([facility] only)     Status: Abnormal   Collection Time: 12/11/14 10:18 PM  Result Value Ref Range   Color, Urine AMBER (A) YELLOW   APPearance CLEAR (A) CLEAR   Glucose, UA NEGATIVE NEGATIVE mg/dL   Bilirubin Urine 1+ (A) NEGATIVE   Ketones, ur 1+ (A) NEGATIVE mg/dL   Specific Gravity, Urine 1.023 1.005 - 1.030   Hgb urine dipstick NEGATIVE NEGATIVE   pH 5.0 5.0 - 8.0   Protein, ur 30 (A) NEGATIVE mg/dL   Nitrite POSITIVE (A) NEGATIVE   Leukocytes, UA 2+ (A) NEGATIVE   RBC / HPF 0-5 0 - 5 RBC/hpf   WBC, UA TOO NUMEROUS TO COUNT 0 - 5 WBC/hpf   Bacteria, UA FEW (A) NONE SEEN   Squamous Epithelial / LPF 0-5 (A) NONE SEEN   Mucous PRESENT   Comprehensive metabolic panel     Status: Abnormal   Collection Time: 12/12/14  4:11 AM  Result Value Ref Range   Sodium  141 135 - 145 mmol/L   Potassium 3.7 3.5 - 5.1 mmol/L   Chloride 108 101 - 111 mmol/L   CO2 28 22 - 32 mmol/L   Glucose, Bld 90 65 - 99 mg/dL   BUN 7 6 - 20 mg/dL   Creatinine, Ser 0.76 0.44 - 1.00 mg/dL   Calcium 8.6 (L) 8.9 - 10.3 mg/dL   Total Protein 6.1 (L) 6.5 - 8.1 g/dL   Albumin 3.4 (L) 3.5 - 5.0 g/dL   AST 424 (H) 15 - 41 U/L   ALT 502 (H) 14 - 54 U/L   Alkaline Phosphatase 89 38 - 126 U/L   Total Bilirubin 3.1 (H) 0.3 - 1.2 mg/dL   GFR calc non Af Amer >60 >60 mL/min   GFR calc Af Amer >60 >60 mL/min   Anion gap 5 5 - 15  CBC     Status: Abnormal   Collection Time: 12/12/14  4:11 AM  Result Value Ref Range   WBC 5.6 3.6 - 11.0 K/uL   RBC 4.12 3.80 - 5.20 MIL/uL   Hemoglobin 11.1 (L) 12.0 - 16.0 g/dL   HCT 34.2 (L) 35.0 - 47.0 %   MCV 82.9 80.0 - 100.0 fL   MCH 26.9 26.0 - 34.0 pg   MCHC 32.4 32.0 - 36.0 g/dL   RDW 13.1 11.5 - 14.5 %   Platelets 196 150 - 440 K/uL     US Abdomen Limited Ruq  12/12/2014   CLINICAL DATA:  Acute onset of right upper quadrant abdominal pain. Initial encounter.  EXAM: US ABDOMEN LIMITED - RIGHT UPPER QUADRANT  COMPARISON:  None.  FINDINGS: Gallbladder:  Scattered small stones are seen dependently within the gallbladder. The gallbladder is otherwise unremarkable. No gallbladder wall thickening or pericholecystic fluid is seen. However, a positive ultrasonographic Murphy's sign is elicited.  Common bile duct:  Diameter: 0.8 cm, dilated in appearance.  Liver:  No focal lesion identified. Within normal limits in parenchymal echogenicity. Mild intrahepatic biliary ductal dilatation is seen.  IMPRESSION: Dilatation of the common bile duct and mild intrahepatic biliary ductal dilatation suggests distal common bile duct obstruction due to a stone. Cholelithiasis noted. Positive ultrasonographic Murphy's sign  likely also reflects obstruction. No evidence for cholecystitis.   Electronically Signed   By: Garald Balding M.D.   On: 12/12/2014 00:28    ROS:   As stated above in the HPI otherwise negative.  Blood pressure 116/82, pulse 58, temperature 98.1 F (36.7 C), temperature source Oral, resp. rate 16, height 5\' 5"  (1.651 m), weight 68.947 kg (152 lb), SpO2 100 %.    PE: Gen: NAD, Alert and Oriented HEENT:  [place]/AT, EOMI Neck: Supple, no LAD Lungs: CTA Bilaterally CV: RRR without M/G/R ABM: Soft, NTND, +BS Ext: No C/C/E  Assessment/Plan: 1) Choledocholithiasis. 2) Cholelithiasis without cholecystitis.   It appears she needs an ERCP with stone extraction.  She will also need to undergo a lap chole after the ERCP in the near future.  I discussed the risks of bleeding, infection, perforation, and pancreatitis with the patient.  She acknowledges the risks and wishes to proceed.  Plan: 1) ERCP tomorrow with anesthesia. 2) Prophylactic antibiotics. 3) Pain control. 4) Surgical consultation.  Keyli Duross D 12/12/2014, 12:01 PM

## 2014-12-13 NOTE — Consult Note (Signed)
Reason for Consult:gallstones Referring Physician: Dr Carol Ada  [place] [name] is an 39 y.o. female.  HPI: 37 yof with history last year of sleeve and good result with weight loss presents with first episode of ruq pain radiating to back and right shoulder.  She was seen at outside facility and noted to have elevated tb.  ruq US shows stones.  She was transferred here.  Pain resolved. Had ercp today with ducts clear.  She has mild ruq pain now.  Consulted for cholecystectomy   Past Medical History  Diagnosis Date  . Hyperlipidemia   . Hypertension   . Hyperglycemia   . Hypothyroidism   . Thyroid mass   . Obesity     Past Surgical History  Procedure Laterality Date  . Partial hysterectomy    . Cesarean section    . Sleeve gastroplasty      Family History  Problem Relation Age of Onset  . Hypertension Mother   . Hyperlipidemia Mother   . Hypertension Father   . Hyperlipidemia Father   . Heart attack Paternal Aunt 55  . Heart attack Paternal Aunt 89    Social History:  reports that she has never smoked. She does not have any smokeless tobacco history on file. She reports that she drinks alcohol. She reports that she does not use illicit drugs.  Allergies: No Known Allergies  Medications: I have reviewed the patient's current medications.  Results for orders placed or performed during the hospital encounter of 12/12/14 (from the past 48 hour(s))  CBC     Status: Abnormal   Collection Time: 12/12/14  6:55 PM  Result Value Ref Range   WBC 5.5 4.0 - 10.5 K/uL   RBC 4.32 3.87 - 5.11 MIL/uL   Hemoglobin 11.3 (L) 12.0 - 15.0 g/dL   HCT 35.5 (L) 36.0 - 46.0 %   MCV 82.2 78.0 - 100.0 fL   MCH 26.2 26.0 - 34.0 pg   MCHC 31.8 30.0 - 36.0 g/dL   RDW 12.9 11.5 - 15.5 %   Platelets 227 150 - 400 K/uL  Creatinine, serum     Status: Abnormal   Collection Time: 12/12/14  6:55 PM  Result Value Ref Range   Creatinine, Ser 1.01 (H) 0.44 - 1.00 mg/dL   GFR calc non Af  Amer >60 >60 mL/min   GFR calc Af Amer >60 >60 mL/min    Comment: (NOTE) The eGFR has been calculated using the CKD EPI equation. This calculation has not been validated in all clinical situations. eGFR's persistently <60 mL/min signify possible Chronic Kidney Disease.   CBC     Status: Abnormal   Collection Time: 12/13/14  3:45 AM  Result Value Ref Range   WBC 4.6 4.0 - 10.5 K/uL   RBC 4.17 3.87 - 5.11 MIL/uL   Hemoglobin 10.8 (L) 12.0 - 15.0 g/dL   HCT 34.2 (L) 36.0 - 46.0 %   MCV 82.0 78.0 - 100.0 fL   MCH 25.9 (L) 26.0 - 34.0 pg   MCHC 31.6 30.0 - 36.0 g/dL   RDW 12.9 11.5 - 15.5 %   Platelets 231 150 - 400 K/uL  Comprehensive metabolic panel     Status: Abnormal   Collection Time: 12/13/14  3:45 AM  Result Value Ref Range   Sodium 140 135 - 145 mmol/L   Potassium 3.7 3.5 - 5.1 mmol/L   Chloride 107 101 - 111 mmol/L   CO2 24 22 - 32 mmol/L   Glucose,  Bld 71 65 - 99 mg/dL   BUN <5 (L) 6 - 20 mg/dL   Creatinine, Ser 0.89 0.44 - 1.00 mg/dL   Calcium 8.3 (L) 8.9 - 10.3 mg/dL   Total Protein 5.0 (L) 6.5 - 8.1 g/dL   Albumin 2.9 (L) 3.5 - 5.0 g/dL   AST 123 (H) 15 - 41 U/L   ALT 335 (H) 14 - 54 U/L   Alkaline Phosphatase 90 38 - 126 U/L   Total Bilirubin 1.1 0.3 - 1.2 mg/dL   GFR calc non Af Amer >60 >60 mL/min   GFR calc Af Amer >60 >60 mL/min    Comment: (NOTE) The eGFR has been calculated using the CKD EPI equation. This calculation has not been validated in all clinical situations. eGFR's persistently <60 mL/min signify possible Chronic Kidney Disease.    Anion gap 9 5 - 15    Dg Ercp Biliary & Pancreatic Ducts  12/13/2014   CLINICAL DATA:  Biliary obstructions suggested on ultrasound. Right upper quadrant pain.  EXAM: ERCP  TECHNIQUE: Multiple spot images obtained with the fluoroscopic device and submitted for interpretation post-procedure.  FLUOROSCOPY TIME:  3 minutes 56 seconds  COMPARISON:  Ultrasound 12/11/2014  FINDINGS: Four fluoroscopic spot images document  endoscopic cannulation and opacification of the CBD. Intrahepatic ducts are incompletely opacified, appearing decompressed centrally. There is partial opacification of the gallbladder. Passage of a balloon tipped catheter in the distal CBD is documented. No extravasation seen.  IMPRESSION: 1. Endoscopic balloon sweep of common duct. These images were submitted for radiologic interpretation only. Please see the procedural report for the amount of contrast and the fluoroscopy time utilized.   Electronically Signed   By: Lucrezia Europe M.D.   On: 12/13/2014 11:33   US Abdomen Limited Ruq  12/12/2014   CLINICAL DATA:  Acute onset of right upper quadrant abdominal pain. Initial encounter.  EXAM: US ABDOMEN LIMITED - RIGHT UPPER QUADRANT  COMPARISON:  None.  FINDINGS: Gallbladder:  Scattered small stones are seen dependently within the gallbladder. The gallbladder is otherwise unremarkable. No gallbladder wall thickening or pericholecystic fluid is seen. However, a positive ultrasonographic Murphy's sign is elicited.  Common bile duct:  Diameter: 0.8 cm, dilated in appearance.  Liver:  No focal lesion identified. Within normal limits in parenchymal echogenicity. Mild intrahepatic biliary ductal dilatation is seen.  IMPRESSION: Dilatation of the common bile duct and mild intrahepatic biliary ductal dilatation suggests distal common bile duct obstruction due to a stone. Cholelithiasis noted. Positive ultrasonographic Murphy's sign likely also reflects obstruction. No evidence for cholecystitis.   Electronically Signed   By: Garald Balding M.D.   On: 12/12/2014 00:28    Review of Systems  Constitutional: Negative for fever and chills.  Respiratory: Negative for shortness of breath.   Gastrointestinal: Positive for abdominal pain.   Blood pressure 134/87, pulse 63, temperature 97.7 F (36.5 C), temperature source Oral, resp. rate 16, height _0  (1.651 m), weight 70.761 kg (156 lb), SpO2 100 %. Physical Exam   Constitutional: She appears well-developed and well-nourished.  Eyes: No scleral icterus.  Neck: Neck supple.  Cardiovascular: Normal rate, regular rhythm and normal heart sounds.   Respiratory: Effort normal and breath sounds normal. She has no wheezes. She has no rales.  GI: Soft. Bowel sounds are normal.  Multiple well healed scars and mild ruq tenderness   Lymphadenopathy:    She has no cervical adenopathy.    Assessment/Plan: Resolved choledocholithiasis, ? Mild cholecystitis  Lap chole in am  tomorrow, npo after mn. Discussed procedure and recovery.   Rumaisa Schnetzer 12/13/2014, 3:22 PM

## 2014-12-14 ENCOUNTER — Inpatient Hospital Stay (HOSPITAL_COMMUNITY): Payer: Commercial Managed Care - HMO | Admitting: Anesthesiology

## 2014-12-14 ENCOUNTER — Encounter (HOSPITAL_COMMUNITY)
Admission: AD | Disposition: A | Discharge status: Home / Self Care | Payer: Self-pay | Source: Other Acute Inpatient Hospital | Attending: Internal Medicine

## 2014-12-14 ENCOUNTER — Encounter (HOSPITAL_COMMUNITY): Payer: Self-pay | Admitting: Certified Registered Nurse Anesthetist

## 2014-12-14 HISTORY — PX: CHOLECYSTECTOMY: SHX55

## 2014-12-14 LAB — COMPREHENSIVE METABOLIC PANEL
ALBUMIN: 2.9 g/dL — AB (ref 3.5–5.0)
ALK PHOS: 93 U/L (ref 38–126)
ALT: 258 U/L — AB (ref 14–54)
AST: 74 U/L — AB (ref 15–41)
Anion gap: 9 (ref 5–15)
BUN: <5 mg/dL — ABNORMAL LOW (ref 6–20)
CHLORIDE: 105 mmol/L (ref 101–111)
CO2: 24 mmol/L (ref 22–32)
CREATININE: 0.83 mg/dL (ref 0.44–1.00)
Calcium: 8.2 mg/dL — ABNORMAL LOW (ref 8.9–10.3)
GFR calc non Af Amer: >60 mL/min (ref >60)
Glucose, Bld: 77 mg/dL (ref 65–99)
Potassium: 3.4 mmol/L — ABNORMAL LOW (ref 3.5–5.1)
SODIUM: 138 mmol/L (ref 135–145)
TOTAL PROTEIN: 5.1 g/dL — AB (ref 6.5–8.1)
Total Bilirubin: 0.8 mg/dL (ref 0.3–1.2)

## 2014-12-14 SURGERY — LAPAROSCOPIC CHOLECYSTECTOMY
Anesthesia: General | Site: Abdomen

## 2014-12-14 MED ORDER — SODIUM CHLORIDE 0.9 % IR SOLN
Status: DC | PRN
  Administered 2014-12-14: 1000 mL

## 2014-12-14 MED ORDER — MIDAZOLAM HCL 2 MG/2ML IJ SOLN
INTRAMUSCULAR | Status: AC
  Filled 2014-12-14: qty 2

## 2014-12-14 MED ORDER — PROMETHAZINE HCL 25 MG/ML IJ SOLN
6.2500 mg | INTRAMUSCULAR | Status: DC | PRN
Start: 2014-12-14 — End: 2014-12-14

## 2014-12-14 MED ORDER — ONDANSETRON HCL 4 MG/2ML IJ SOLN
INTRAMUSCULAR | Status: DC | PRN
  Administered 2014-12-14: 4 mg via INTRAVENOUS

## 2014-12-14 MED ORDER — SCOPOLAMINE 1 MG/3DAYS TD PT72
MEDICATED_PATCH | TRANSDERMAL | Status: AC
Start: 2014-12-14 — End: 2014-12-14
  Administered 2014-12-14: 1 via TRANSDERMAL
  Filled 2014-12-14: qty 1

## 2014-12-14 MED ORDER — ONDANSETRON HCL 4 MG/2ML IJ SOLN
INTRAMUSCULAR | Status: AC
  Filled 2014-12-14: qty 2

## 2014-12-14 MED ORDER — BUPIVACAINE-EPINEPHRINE (PF) 0.25% -1:200000 IJ SOLN
INTRAMUSCULAR | Status: AC
  Filled 2014-12-14: qty 30

## 2014-12-14 MED ORDER — MEPERIDINE HCL 25 MG/ML IJ SOLN: 6.2500 mg | INTRAMUSCULAR | Status: DC | PRN

## 2014-12-14 MED ORDER — PHENYLEPHRINE HCL 10 MG/ML IJ SOLN
INTRAMUSCULAR | Status: DC | PRN
  Administered 2014-12-14: 40 ug via INTRAVENOUS

## 2014-12-14 MED ORDER — ROCURONIUM BROMIDE 50 MG/5ML IV SOLN
INTRAVENOUS | Status: AC
  Filled 2014-12-14: qty 1

## 2014-12-14 MED ORDER — ROCURONIUM BROMIDE 100 MG/10ML IV SOLN
INTRAVENOUS | Status: DC | PRN
  Administered 2014-12-14: 30 mg via INTRAVENOUS

## 2014-12-14 MED ORDER — LIDOCAINE HCL (CARDIAC) 20 MG/ML IV SOLN
INTRAVENOUS | Status: AC
  Filled 2014-12-14: qty 5

## 2014-12-14 MED ORDER — HYDROMORPHONE HCL 1 MG/ML IJ SOLN
INTRAMUSCULAR | Status: AC
  Filled 2014-12-14: qty 1

## 2014-12-14 MED ORDER — NEOSTIGMINE METHYLSULFATE 10 MG/10ML IV SOLN
INTRAVENOUS | Status: AC
  Filled 2014-12-14: qty 4

## 2014-12-14 MED ORDER — PROPOFOL 10 MG/ML IV BOLUS
INTRAVENOUS | Status: AC
  Filled 2014-12-14: qty 20

## 2014-12-14 MED ORDER — GLYCOPYRROLATE 0.2 MG/ML IJ SOLN
INTRAMUSCULAR | Status: DC | PRN
  Administered 2014-12-14: 0.4 mg via INTRAVENOUS

## 2014-12-14 MED ORDER — SUCCINYLCHOLINE CHLORIDE 20 MG/ML IJ SOLN
INTRAMUSCULAR | Status: AC
  Filled 2014-12-14: qty 1

## 2014-12-14 MED ORDER — FENTANYL CITRATE (PF) 100 MCG/2ML IJ SOLN
INTRAMUSCULAR | Status: DC | PRN
  Administered 2014-12-14 (×4): 50 ug via INTRAVENOUS

## 2014-12-14 MED ORDER — GLYCOPYRROLATE 0.2 MG/ML IJ SOLN
INTRAMUSCULAR | Status: AC
  Filled 2014-12-14: qty 2

## 2014-12-14 MED ORDER — FENTANYL CITRATE (PF) 250 MCG/5ML IJ SOLN
INTRAMUSCULAR | Status: AC
  Filled 2014-12-14: qty 5

## 2014-12-14 MED ORDER — LIDOCAINE HCL (CARDIAC) 20 MG/ML IV SOLN
INTRAVENOUS | Status: DC | PRN
  Administered 2014-12-14: 50 mg via INTRAVENOUS

## 2014-12-14 MED ORDER — EPHEDRINE SULFATE 50 MG/ML IJ SOLN
INTRAMUSCULAR | Status: AC
  Filled 2014-12-14: qty 1

## 2014-12-14 MED ORDER — HYDROMORPHONE HCL 1 MG/ML IJ SOLN
0.2500 mg | INTRAMUSCULAR | Status: DC | PRN
  Administered 2014-12-14 (×2): 0.5 mg via INTRAVENOUS

## 2014-12-14 MED ORDER — PHENYLEPHRINE 40 MCG/ML (10ML) SYRINGE FOR IV PUSH (FOR BLOOD PRESSURE SUPPORT)
PREFILLED_SYRINGE | INTRAVENOUS | Status: AC
  Filled 2014-12-14: qty 10

## 2014-12-14 MED ORDER — DEXAMETHASONE SODIUM PHOSPHATE 4 MG/ML IJ SOLN
INTRAMUSCULAR | Status: DC | PRN
  Administered 2014-12-14: 8 mg via INTRAVENOUS

## 2014-12-14 MED ORDER — DEXAMETHASONE SODIUM PHOSPHATE 4 MG/ML IJ SOLN
INTRAMUSCULAR | Status: AC
  Filled 2014-12-14: qty 2

## 2014-12-14 MED ORDER — LACTATED RINGERS IV SOLN
INTRAVENOUS | Status: DC | PRN
  Administered 2014-12-14 (×2): via INTRAVENOUS

## 2014-12-14 MED ORDER — BUPIVACAINE-EPINEPHRINE 0.25% -1:200000 IJ SOLN
INTRAMUSCULAR | Status: DC | PRN
  Administered 2014-12-14: 9 mL

## 2014-12-14 MED ORDER — PROPOFOL 10 MG/ML IV BOLUS
INTRAVENOUS | Status: AC
Start: 2014-12-14 — End: 2014-12-14
  Filled 2014-12-14: qty 20

## 2014-12-14 MED ORDER — STERILE WATER FOR INJECTION IJ SOLN
INTRAMUSCULAR | Status: AC
  Filled 2014-12-14: qty 10

## 2014-12-14 MED ORDER — POTASSIUM CHLORIDE CRYS ER 20 MEQ PO TBCR
40.0000 meq | EXTENDED_RELEASE_TABLET | Freq: Once | ORAL | Status: AC
  Administered 2014-12-14: 40 meq via ORAL
  Filled 2014-12-14: qty 2

## 2014-12-14 MED ORDER — PROPOFOL 10 MG/ML IV BOLUS
INTRAVENOUS | Status: DC | PRN
  Administered 2014-12-14: 140 mg via INTRAVENOUS

## 2014-12-14 MED ORDER — 0.9 % SODIUM CHLORIDE (POUR BTL) OPTIME
TOPICAL | Status: DC | PRN
  Administered 2014-12-14: 1000 mL

## 2014-12-14 MED ORDER — NEOSTIGMINE METHYLSULFATE 10 MG/10ML IV SOLN
INTRAVENOUS | Status: DC | PRN
  Administered 2014-12-14: 3 mg via INTRAVENOUS

## 2014-12-14 MED ORDER — MIDAZOLAM HCL 5 MG/5ML IJ SOLN
INTRAMUSCULAR | Status: DC | PRN
  Administered 2014-12-14: 2 mg via INTRAVENOUS

## 2014-12-14 MED ORDER — EPHEDRINE SULFATE 50 MG/ML IJ SOLN
INTRAMUSCULAR | Status: DC | PRN
  Administered 2014-12-14 (×2): 5 mg via INTRAVENOUS

## 2014-12-14 SURGICAL SUPPLY — 49 items
APL SKNCLS STERI-STRIP NONHPOA (GAUZE/BANDAGES/DRESSINGS) ×2
APPLIER CLIP ROT 10 11.4 M/L (STAPLE) ×3
APR CLP MED LRG 11.4X10 (STAPLE) ×2
BAG SPEC RTRVL LRG 6X4 10 (ENDOMECHANICALS) ×2
BENZOIN TINCTURE PRP APPL 2/3 (GAUZE/BANDAGES/DRESSINGS) ×3 IMPLANT
BLADE SURG ROTATE 9660 (MISCELLANEOUS) IMPLANT
CANISTER SUCTION 2500CC (MISCELLANEOUS) ×3 IMPLANT
CHLORAPREP W/TINT 26ML (MISCELLANEOUS) ×3 IMPLANT
CLIP APPLIE ROT 10 11.4 M/L (STAPLE) ×2 IMPLANT
COVER MAYO STAND STRL (DRAPES) ×1 IMPLANT
COVER SURGICAL LIGHT HANDLE (MISCELLANEOUS) ×3 IMPLANT
DRAPE C-ARM 42X72 X-RAY (DRAPES) ×1 IMPLANT
DRSG TEGADERM 2-3/8X2-3/4 SM (GAUZE/BANDAGES/DRESSINGS) ×9 IMPLANT
DRSG TEGADERM 4X4.75 (GAUZE/BANDAGES/DRESSINGS) ×3 IMPLANT
ELECT REM PT RETURN 9FT ADLT (ELECTROSURGICAL) ×3
ELECTRODE REM PT RTRN 9FT ADLT (ELECTROSURGICAL) ×2 IMPLANT
FILTER SMOKE EVAC LAPAROSHD (FILTER) ×3 IMPLANT
GAUZE SPONGE 2X2 8PLY STRL LF (GAUZE/BANDAGES/DRESSINGS) ×2 IMPLANT
GLOVE BIO SURGEON STRL SZ 6.5 (GLOVE) ×2 IMPLANT
GLOVE BIO SURGEON STRL SZ7 (GLOVE) ×3 IMPLANT
GLOVE BIOGEL PI IND STRL 6.5 (GLOVE) ×2 IMPLANT
GLOVE BIOGEL PI IND STRL 7.0 (GLOVE) ×1 IMPLANT
GLOVE BIOGEL PI IND STRL 7.5 (GLOVE) ×2 IMPLANT
GLOVE BIOGEL PI INDICATOR 6.5 (GLOVE) ×2
GLOVE BIOGEL PI INDICATOR 7.0 (GLOVE) ×1
GLOVE BIOGEL PI INDICATOR 7.5 (GLOVE) ×1
GLOVE ECLIPSE 6.5 STRL STRAW (GLOVE) ×2 IMPLANT
GOWN STRL REUS W/ TWL LRG LVL3 (GOWN DISPOSABLE) ×6 IMPLANT
GOWN STRL REUS W/TWL LRG LVL3 (GOWN DISPOSABLE) ×9
KIT BASIN OR (CUSTOM PROCEDURE TRAY) ×3 IMPLANT
KIT ROOM TURNOVER OR (KITS) ×3 IMPLANT
NS IRRIG 1000ML POUR BTL (IV SOLUTION) ×3 IMPLANT
PAD ARMBOARD 7.5X6 YLW CONV (MISCELLANEOUS) ×3 IMPLANT
POUCH SPECIMEN RETRIEVAL 10MM (ENDOMECHANICALS) ×3 IMPLANT
SCISSORS LAP 5X35 DISP (ENDOMECHANICALS) ×3 IMPLANT
SET CHOLANGIOGRAPH 5 50 .035 (SET/KITS/TRAYS/PACK) ×1 IMPLANT
SET IRRIG TUBING LAPAROSCOPIC (IRRIGATION / IRRIGATOR) ×3 IMPLANT
SLEEVE ENDOPATH XCEL 5M (ENDOMECHANICALS) ×3 IMPLANT
SPECIMEN JAR SMALL (MISCELLANEOUS) ×3 IMPLANT
SPONGE GAUZE 2X2 STER 10/PKG (GAUZE/BANDAGES/DRESSINGS) ×1
STRIP CLOSURE SKIN 1/2X4 (GAUZE/BANDAGES/DRESSINGS) ×2 IMPLANT
SUT MNCRL AB 4-0 PS2 18 (SUTURE) ×3 IMPLANT
TOWEL OR 17X24 6PK STRL BLUE (TOWEL DISPOSABLE) ×3 IMPLANT
TOWEL OR 17X26 10 PK STRL BLUE (TOWEL DISPOSABLE) ×3 IMPLANT
TRAY LAPAROSCOPIC MC (CUSTOM PROCEDURE TRAY) ×3 IMPLANT
TROCAR XCEL BLUNT TIP 100MML (ENDOMECHANICALS) ×3 IMPLANT
TROCAR XCEL NON-BLD 11X100MML (ENDOMECHANICALS) ×3 IMPLANT
TROCAR XCEL NON-BLD 5MMX100MML (ENDOMECHANICALS) ×3 IMPLANT
TUBING INSUFFLATION (TUBING) ×3 IMPLANT

## 2014-12-14 NOTE — Transfer of Care (Signed)
Immediate Anesthesia Transfer of Care Note  Patient: [name]  Procedure(s) Performed: Procedure(s): LAPAROSCOPIC CHOLECYSTECTOMY (N/A)  Patient Location: PACU  Anesthesia Type:General  Level of Consciousness: awake, alert  and oriented  Airway & Oxygen Therapy: Patient Spontanous Breathing and Patient connected to nasal cannula oxygen  Post-op Assessment: Report given to RN, Post -op Vital signs reviewed and stable and Patient moving all extremities X 4  Post vital signs: Reviewed and stable  Last Vitals:  Filed Vitals:   12/14/14 0534  BP: 107/75  Pulse: 67  Temp: 37.2 C  Resp: 16    Complications: No apparent anesthesia complications

## 2014-12-14 NOTE — Progress Notes (Signed)
PROGRESS NOTE    [name] JGG:[id number] DOB: [birth year] DOA: 12/12/2014 PCP: Casilda Carls, MD  HPI/Brief narrative 39 year old female with history of HLD, HTN, hypothyroid, obesity status post sleeve gastropathy, presented to [facility] on 12/20/14 with abdominal pain, nausea and abnormal LFTs. Ultrasound abdomen showed choledocholithiasis without cholecystitis. UA suggestive of UTI and patient was started on Cipro. Since [facility] does not have GI coverage this weekend, she was transferred to [facility] for evaluation by GI.   Assessment/Plan:  Cholelithiasis without cholecystitis/choledocholithiasis, s/p Lap Choly 7/4 - Treated supportively with bowel rest, IV fluids and pain management. - GI consulted and patient underwent ERCP with stone extraction on 7/3 - S/P laparoscopic cholecystectomy 12/14/14. -Management per surgery. Possible DC home 7/5.   Presumed UTI - Continue Cipro. Unfortunately no urine cultures were sent on admission  Essential hypertension - Controlled  Hypothyroid -Check TSH    Anemia - Stable  Hypokalemia - Replace and follow    DVT prophylaxis: Heparin Code Status: Full Family Communication: Discussed with patient's mother at bedside on 7/3. None at bedside today  Disposition Plan: DC home when medically stable, possibly 11/5   Consultants:  GI-Dr. [place] surgery  Procedures:  ERCP 7/3  Laparoscopic cholecystectomy 7/4  Antibiotics:  Cipro 7/2 >   Subjective: Seen post laparoscopic cholecystectomy. Abdomen with appropriate shortness but denied any other complaints.  Objective: Filed Vitals:   12/14/14 1100 12/14/14 1115 12/14/14 1128 12/14/14 1147  BP: 125/90 121/72 113/74 131/80  Pulse: 71 62 67 65  Temp:   97.4 F (36.3 C) 98.1 F (36.7 C)  TempSrc:    Oral  Resp: 16 14 17 16   Height:      Weight:      SpO2: 99% 100% 99% 98%    Intake/Output Summary (Last 24 hours) at 12/14/14  1608 Last data filed at 12/14/14 1434  Gross per 24 hour  Intake 3567.5 ml  Output     30 ml  Net 3537.5 ml   Filed Weights   12/12/14 1433 12/13/14 1010  Weight: 71.1 kg (156 lb 12 oz) 70.761 kg (156 lb)     Exam:  General exam: Pleasant young female lying comfortably in bed. Respiratory system: Clear. No increased work of breathing. Cardiovascular system: S1 & S2 heard, RRR. No JVD, murmurs, gallops, clicks or pedal edema. Gastrointestinal system: Abdomen is nondistended, soft. Diffuse mild appropriate post op tenderness without peritoneal signs . Normal bowel sounds heard. Central nervous system: Alert and oriented. No focal neurological deficits. Extremities: Symmetric 5 x 5 power.   Data Reviewed: Basic Metabolic Panel:  Recent Labs Lab 12/11/14 0050 12/11/14 2218 12/12/14 0411 12/12/14 1855 12/13/14 0345 12/14/14 0330  NA 142 141 141  --  140 138  K 3.2* 4.2 3.7  --  3.7 3.4*  CL 107 107 108  --  107 105  CO2 26 26 28   --  24 24  GLUCOSE 120* 104* 90  --  71 77  BUN 11 7 7   --  <5* <5*  CREATININE 0.88 0.69 0.76 1.01* 0.89 0.83  CALCIUM 8.8* 9.0 8.6*  --  8.3* 8.2*   Liver Function Tests:  Recent Labs Lab 12/11/14 0050 12/11/14 2218 12/12/14 0411 12/13/14 0345 12/14/14 0330  AST 102* 489* 424* 123* 74*  ALT 59* 518* 502* 335* 258*  ALKPHOS 46 89 89 90 93  BILITOT 0.5 3.0* 3.1* 1.1 0.8  PROT 6.5 6.8 6.1* 5.0* 5.1*  ALBUMIN 3.8 3.9 3.4*  2.9* 2.9*   No results for input(s): LIPASE, AMYLASE in the last 168 hours. No results for input(s): AMMONIA in the last 168 hours. CBC:  Recent Labs Lab 12/11/14 0050 12/11/14 2218 12/12/14 0411 12/12/14 1855 12/13/14 0345  WBC 11.5* 7.9 5.6 5.5 4.6  NEUTROABS  --  6.4  --   --   --   HGB 11.8* 11.8* 11.1* 11.3* 10.8*  HCT 37.3 37.2 34.2* 35.5* 34.2*  MCV 82.8 82.7 82.9 82.2 82.0  PLT 229 236 196 227 231   Cardiac Enzymes:  Recent Labs Lab 12/11/14 0050  CKTOTAL 76  TROPONINI <0.03   BNP (last 3  results) No results for input(s): PROBNP in the last 8760 hours. CBG: No results for input(s): GLUCAP in the last 168 hours.  Recent Results (from the past 240 hour(s))  Surgical pcr screen     Status: None   Collection Time: 12/13/14  8:43 PM  Result Value Ref Range Status   MRSA, PCR NEGATIVE NEGATIVE Final   Staphylococcus aureus NEGATIVE NEGATIVE Final    Comment:        The Xpert SA Assay (FDA approved for NASAL specimens in patients over 79 years of age), is one component of a comprehensive surveillance program.  Test performance has been validated by [facility] for patients greater than or equal to 3 year old. It is not intended to diagnose infection nor to guide or monitor treatment.          Studies: Dg Ercp Biliary & Pancreatic Ducts  12/13/2014   CLINICAL DATA:  Biliary obstructions suggested on ultrasound. Right upper quadrant pain.  EXAM: ERCP  TECHNIQUE: Multiple spot images obtained with the fluoroscopic device and submitted for interpretation post-procedure.  FLUOROSCOPY TIME:  3 minutes 56 seconds  COMPARISON:  Ultrasound 12/11/2014  FINDINGS: Four fluoroscopic spot images document endoscopic cannulation and opacification of the CBD. Intrahepatic ducts are incompletely opacified, appearing decompressed centrally. There is partial opacification of the gallbladder. Passage of a balloon tipped catheter in the distal CBD is documented. No extravasation seen.  IMPRESSION: 1. Endoscopic balloon sweep of common duct. These images were submitted for radiologic interpretation only. Please see the procedural report for the amount of contrast and the fluoroscopy time utilized.   Electronically Signed   By: Lucrezia Europe M.D.   On: 12/13/2014 11:33        Scheduled Meds: . ciprofloxacin  400 mg Intravenous Q12H  . heparin  5,000 Units Subcutaneous 3 times per day  . HYDROmorphone       Continuous Infusions: . sodium chloride 75 mL/hr at 12/13/14 1255    Active  Problems:   Choledocholithiasis   UTI (urinary tract infection)    Time spent: 20 minutes    Kataleena Holsapple, MD, FACP, FHM. [facility] Pager [phone]  If 7PM-7AM, please contact night-coverage [email or web address] Password [email or web address] 12/14/2014, 4:08 PM    LOS: 2 days

## 2014-12-14 NOTE — Anesthesia Preprocedure Evaluation (Addendum)
Anesthesia Evaluation  Patient identified by MRN, date of birth, ID band Patient awake    Reviewed: Allergy & Precautions, NPO status , Patient's Chart, lab work & pertinent test results  Airway Mallampati: I  TM Distance: >3 FB Neck ROM: Full    Dental  (+) Teeth Intact, Dental Advisory Given   Pulmonary neg pulmonary ROS,  breath sounds clear to auscultation        Cardiovascular hypertension, Rhythm:Regular Rate:Normal     Neuro/Psych negative neurological ROS     GI/Hepatic Hx of gastric sleeve   Endo/Other  Hypothyroidism   Renal/GU      Musculoskeletal negative musculoskeletal ROS (+)   Abdominal   Peds  Hematology negative hematology ROS (+)   Anesthesia Other Findings   Reproductive/Obstetrics                           Anesthesia Physical  Anesthesia Plan  ASA: II  Anesthesia Plan: General   Post-op Pain Management:    Induction: Intravenous  Airway Management Planned: Oral ETT  Additional Equipment:   Intra-op Plan:   Post-operative Plan: Extubation in OR  Informed Consent: I have reviewed the patients History and Physical, chart, labs and discussed the procedure including the risks, benefits and alternatives for the proposed anesthesia with the patient or authorized representative who has indicated his/her understanding and acceptance.   Dental advisory given  Plan Discussed with: CRNA  Anesthesia Plan Comments:         Anesthesia Quick Evaluation

## 2014-12-14 NOTE — Progress Notes (Signed)
Subjective: No acute events.  Feeling well.  No problems post ERCP.  Objective: Vital signs in last 24 hours: Temp:  [97.6 F (36.4 C)-99.2 F (37.3 C)] 99 F (37.2 C) (07/04 0534) Pulse Rate:  [62-122] 67 (07/04 0534) Resp:  [12-22] 16 (07/04 0534) BP: (107-136)/(75-88) 107/75 mmHg (07/04 0534) SpO2:  [98 %-100 %] 99 % (07/04 0534) Weight:  [70.761 kg (156 lb)] 70.761 kg (156 lb) (07/03 1010) Last BM Date: 12/10/14  Intake/Output from previous day: 07/03 0701 - 07/04 0700 In: 2808.8 [P.O.:240; I.V.:2368.8; IV Piggyback:200] Out: 500 [Urine:500] Intake/Output this shift:    General appearance: alert and no distress GI: soft, non-tender; bowel sounds normal; no masses,  no organomegaly  Lab Results:  Recent Labs  12/12/14 0411 12/12/14 1855 12/13/14 0345  WBC 5.6 5.5 4.6  HGB 11.1* 11.3* 10.8*  HCT 34.2* 35.5* 34.2*  PLT 196 227 231   BMET  Recent Labs  12/12/14 0411 12/12/14 1855 12/13/14 0345 12/14/14 0330  NA 141  --  140 138  K 3.7  --  3.7 3.4*  CL 108  --  107 105  CO2 28  --  24 24  GLUCOSE 90  --  71 77  BUN 7  --  <5* <5*  CREATININE 0.76 1.01* 0.89 0.83  CALCIUM 8.6*  --  8.3* 8.2*   LFT  Recent Labs  12/14/14 0330  PROT 5.1*  ALBUMIN 2.9*  AST 74*  ALT 258*  ALKPHOS 93  BILITOT 0.8   PT/INR No results for input(s): LABPROT, INR in the last 72 hours. Hepatitis Panel No results for input(s): HEPBSAG, HCVAB, HEPAIGM, HEPBIGM in the last 72 hours. C-Diff No results for input(s): CDIFFTOX in the last 72 hours. Fecal Lactopherrin No results for input(s): FECLLACTOFRN in the last 72 hours.  Studies/Results: Dg Ercp Biliary & Pancreatic Ducts  12/13/2014   CLINICAL DATA:  Biliary obstructions suggested on ultrasound. Right upper quadrant pain.  EXAM: ERCP  TECHNIQUE: Multiple spot images obtained with the fluoroscopic device and submitted for interpretation post-procedure.  FLUOROSCOPY TIME:  3 minutes 56 seconds  COMPARISON:  Ultrasound  12/11/2014  FINDINGS: Four fluoroscopic spot images document endoscopic cannulation and opacification of the CBD. Intrahepatic ducts are incompletely opacified, appearing decompressed centrally. There is partial opacification of the gallbladder. Passage of a balloon tipped catheter in the distal CBD is documented. No extravasation seen.  IMPRESSION: 1. Endoscopic balloon sweep of common duct. These images were submitted for radiologic interpretation only. Please see the procedural report for the amount of contrast and the fluoroscopy time utilized.   Electronically Signed   By: Lucrezia Europe M.D.   On: 12/13/2014 11:33    Medications:  Scheduled: . [MAR Hold] ciprofloxacin  400 mg Intravenous Q12H  . [MAR Hold] heparin  5,000 Units Subcutaneous 3 times per day   Continuous: . sodium chloride 75 mL/hr at 12/13/14 1255    Assessment/Plan: 1) S/p choledocholithiasis. 2) Cholelithiasis.   No issues with post-ERCP pancreatitis.  This was a concern as the PD was cannulated.  She is on for a lap chole today.  Plan: 1) Lap chole per Surgery. 2) Signing off.   LOS: 2 days   [place] Jakubiak D 12/14/2014, 8:26 AM

## 2014-12-14 NOTE — Anesthesia Procedure Notes (Signed)
Procedure Name: Intubation Date/Time: 12/14/2014 9:23 AM Performed by: Garrison Columbus T Pre-anesthesia Checklist: Patient identified, Emergency Drugs available, Suction available and Patient being monitored Patient Re-evaluated:Patient Re-evaluated prior to inductionOxygen Delivery Method: Circle system utilized Preoxygenation: Pre-oxygenation with 100% oxygen Intubation Type: IV induction Ventilation: Mask ventilation without difficulty Laryngoscope Size: Miller and 2 Grade View: Grade I Tube size: 7.5 mm Number of attempts: 1 Airway Equipment and Method: Stylet Placement Confirmation: ETT inserted through vocal cords under direct vision,  positive ETCO2 and breath sounds checked- equal and bilateral Secured at: 21 cm Tube secured with: Tape Dental Injury: Teeth and Oropharynx as per pre-operative assessment

## 2014-12-14 NOTE — Op Note (Signed)
Laparoscopic Cholecystectomy Procedure Note  Indications: This patient presents with symptomatic gallbladder disease, including obstruction of the common bile duct.  She underwent ERCP with sphincterotomy yesterday and will undergo laparoscopic cholecystectomy today.  Pre-operative Diagnosis: Calculus of bile duct with other cholecystitis and obstruction  Post-operative Diagnosis: Same  Surgeon: Aluel Schwarz K.   Assistants: none  Anesthesia: General endotracheal anesthesia  ASA Class: 1  Procedure Details  The patient was seen again in the Holding Room. The risks, benefits, complications, treatment options, and expected outcomes were discussed with the patient. The possibilities of reaction to medication, pulmonary aspiration, perforation of viscus, bleeding, recurrent infection, finding a normal gallbladder, the need for additional procedures, failure to diagnose a condition, the possible need to convert to an open procedure, and creating a complication requiring transfusion or operation were discussed with the patient. The likelihood of improving the patient's symptoms with return to their baseline status is good.  The patient and/or family concurred with the proposed plan, giving informed consent. The site of surgery properly noted. The patient was taken to Operating Room, identified as [name] and the procedure verified as Laparoscopic Cholecystectomy with Intraoperative Cholangiogram. A Time Out was held and the above information confirmed.  Prior to the induction of general anesthesia, antibiotic prophylaxis was administered. General endotracheal anesthesia was then administered and tolerated well. After the induction, the abdomen was prepped with Chloraprep and draped in sterile fashion. The patient was positioned in the supine position.  Local anesthetic agent was injected into the skin below the umbilicus and an incision made. We dissected down to the abdominal fascia  with blunt dissection.  The fascia was incised vertically and we entered the peritoneal cavity bluntly.  A pursestring suture of 0-Vicryl was placed around the fascial opening.  The Hasson cannula was inserted and secured with the stay suture.  Pneumoperitoneum was then created with CO2 and tolerated well without any adverse changes in the patient's vital signs. An 11-mm port was placed in the subxiphoid position.  Two 5-mm ports were placed in the right upper quadrant. All skin incisions were infiltrated with a local anesthetic agent before making the incision and placing the trocars.   We positioned the patient in reverse Trendelenburg, tilted slightly to the patient's left.  The gallbladder was identified, the fundus grasped and retracted cephalad. Adhesions were lysed bluntly and with the electrocautery where indicated, taking care not to injure any adjacent organs or viscus. The infundibulum was grasped and retracted laterally, exposing the peritoneum overlying the triangle of Calot. This was then divided and exposed in a blunt fashion. The cystic duct was clearly identified and bluntly dissected circumferentially. A critical view of the cystic duct and cystic artery was obtained.  The cystic duct was then ligated with clips and divided. The cystic artery was, dissected free, ligated with clips and divided as well.   The gallbladder was dissected from the liver bed in retrograde fashion with the electrocautery. The gallbladder was removed and placed in an Endocatch sac.  A few stones were spilled, but these were picked up with the stone scoop and suction. The liver bed was irrigated and inspected. Hemostasis was achieved with the electrocautery. Copious irrigation was utilized and was repeatedly aspirated until clear.  The gallbladder and Endocatch sac were then removed through the umbilical port site.  The pursestring suture was used to close the umbilical fascia.    We again inspected the right upper  quadrant for hemostasis.  Pneumoperitoneum was released as we  removed the trocars.  4-0 Monocryl was used to close the skin.   Benzoin, steri-strips, and clean dressings were applied. The patient was then extubated and brought to the recovery room in stable condition. Instrument, sponge, and needle counts were correct at closure and at the conclusion of the case.   Findings: Cholecystitis with Cholelithiasis  Estimated Blood Loss: Minimal         Drains: none         Specimens: Gallbladder           Complications: None; patient tolerated the procedure well.         Disposition: PACU - hemodynamically stable.         Condition: stable

## 2014-12-14 NOTE — Anesthesia Postprocedure Evaluation (Signed)
Anesthesia Post Note  Patient: [name]  Procedure(s) Performed: Procedure(s) (LRB): LAPAROSCOPIC CHOLECYSTECTOMY (N/A)  Anesthesia type: General  Patient location: PACU  Post pain: Pain level controlled  Post assessment: Post-op Vital signs reviewed  Last Vitals: BP 131/80 mmHg  Pulse 65  Temp(Src) 36.7 C (Oral)  Resp 16  Ht 5\' 5"  (1.651 m)  Wt 156 lb (70.761 kg)  BMI 25.96 kg/m2  SpO2 98%  Post vital signs: Reviewed  Level of consciousness: sedated  Complications: No apparent anesthesia complications

## 2014-12-15 ENCOUNTER — Encounter (HOSPITAL_COMMUNITY): Payer: Self-pay | Admitting: Surgery

## 2014-12-15 DIAGNOSIS — D649 Anemia, unspecified: Secondary | ICD-10-CM

## 2014-12-15 LAB — HEMOGLOBIN A1C
Hgb A1c MFr Bld: 5.3 % (ref 4.8–5.6)
MEAN PLASMA GLUCOSE: 105 mg/dL

## 2014-12-15 LAB — CBC
HCT: 36 % (ref 36.0–46.0)
HEMOGLOBIN: 11.7 g/dL — AB (ref 12.0–15.0)
MCH: 26.5 pg (ref 26.0–34.0)
MCHC: 32.5 g/dL (ref 30.0–36.0)
MCV: 81.4 fL (ref 78.0–100.0)
Platelets: 212 10*3/uL (ref 150–400)
RBC: 4.42 MIL/uL (ref 3.87–5.11)
RDW: 12.6 % (ref 11.5–15.5)
WBC: 9 10*3/uL (ref 4.0–10.5)

## 2014-12-15 LAB — COMPREHENSIVE METABOLIC PANEL
ALT: 215 U/L — ABNORMAL HIGH (ref 14–54)
ANION GAP: 10 (ref 5–15)
AST: 56 U/L — ABNORMAL HIGH (ref 15–41)
Albumin: 3.1 g/dL — ABNORMAL LOW (ref 3.5–5.0)
Alkaline Phosphatase: 94 U/L (ref 38–126)
CHLORIDE: 108 mmol/L (ref 101–111)
CO2: 22 mmol/L (ref 22–32)
CREATININE: 0.91 mg/dL (ref 0.44–1.00)
Calcium: 8.7 mg/dL — ABNORMAL LOW (ref 8.9–10.3)
GFR calc non Af Amer: >60 mL/min (ref >60)
Glucose, Bld: 119 mg/dL — ABNORMAL HIGH (ref 65–99)
Potassium: 3.6 mmol/L (ref 3.5–5.1)
Sodium: 140 mmol/L (ref 135–145)
Total Bilirubin: 0.9 mg/dL (ref 0.3–1.2)
Total Protein: 5.4 g/dL — ABNORMAL LOW (ref 6.5–8.1)

## 2014-12-15 MED ORDER — HYDROCODONE-ACETAMINOPHEN 5-325 MG PO TABS: 1.0000 | ORAL_TABLET | Freq: Four times a day (QID) | ORAL | Status: DC | PRN

## 2014-12-15 NOTE — Anesthesia Postprocedure Evaluation (Signed)
  Anesthesia Post-op Note  Patient: [name]  Procedure(s) Performed: Procedure(s): ENDOSCOPIC RETROGRADE CHOLANGIOPANCREATOGRAPHY (ERCP) (N/A)  Patient Location: PACU  Anesthesia Type:General  Level of Consciousness: awake and alert   Airway and Oxygen Therapy: Patient Spontanous Breathing  Post-op Pain: none  Post-op Assessment: Post-op Vital signs reviewed              Post-op Vital Signs: stable  Last Vitals:  Filed Vitals:   12/15/14 0629  BP: 109/75  Pulse: 77  Temp: 36.7 C  Resp: 17    Complications: No apparent anesthesia complications

## 2014-12-15 NOTE — Progress Notes (Signed)
[facility] Progress Note  1 Day Post-Op  Subjective: Pt doing fantastic, wants to go home.  Tolerating reg diet.  Ambulating well.  +flatus, no BM yet.  Pain well controlled.  Mother at bedside.  Objective: Vital signs in last 24 hours: Temp:  [97.4 F (36.3 C)-99.1 F (37.3 C)] 98.1 F (36.7 C) (07/05 0629) Pulse Rate:  [62-85] 77 (07/05 0629) Resp:  [14-17] 17 (07/05 0629) BP: (109-131)/(68-90) 109/75 mmHg (07/05 0629) SpO2:  [97 %-100 %] 99 % (07/05 0629) Last BM Date: 12/11/14  Intake/Output from previous day: 07/04 0701 - 07/05 0700 In: 4340 [P.O.:740; I.V.:3000; IV Piggyback:600] Out: 30 [Blood:30] Intake/Output this shift:    PE: Gen:  Alert, NAD, pleasant Abd: Soft, ND, mild tenderness over incision sites, no HSM, incisions C/D/I with tegaderms in place   Lab Results:   Recent Labs  12/13/14 0345 12/15/14 0313  WBC 4.6 9.0  HGB 10.8* 11.7*  HCT 34.2* 36.0  PLT 231 212   BMET  Recent Labs  12/14/14 0330 12/15/14 0313  NA 138 140  K 3.4* 3.6  CL 105 108  CO2 24 22  GLUCOSE 77 119*  BUN <5* <5*  CREATININE 0.83 0.91  CALCIUM 8.2* 8.7*   PT/INR No results for input(s): LABPROT, INR in the last 72 hours. CMP     Component Value Date/Time   NA 140 12/15/2014 0313   NA 141 04/07/2014 0833   K 3.6 12/15/2014 0313   K 3.4* 04/07/2014 0833   CL 108 12/15/2014 0313   CL 105 04/07/2014 0833   CO2 22 12/15/2014 0313   CO2 28 04/07/2014 0833   GLUCOSE 119* 12/15/2014 0313   GLUCOSE 97 04/07/2014 0833   BUN <5* 12/15/2014 0313   BUN 9 04/07/2014 0833   CREATININE 0.91 12/15/2014 0313   CREATININE 0.99 04/07/2014 0833   CALCIUM 8.7* 12/15/2014 0313   CALCIUM 8.7 04/07/2014 0833   PROT 5.4* 12/15/2014 0313   PROT 7.0 02/06/2014 0750   ALBUMIN 3.1* 12/15/2014 0313   ALBUMIN 3.7 02/06/2014 0750   AST 56* 12/15/2014 0313   AST 17 02/06/2014 0750   ALT 215* 12/15/2014 0313   ALT 36 02/06/2014 0750   ALKPHOS 94 12/15/2014 0313   ALKPHOS 57 02/06/2014 0750   BILITOT 0.9 12/15/2014 0313   GFRNONAA >60 12/15/2014 0313   GFRNONAA >60 02/06/2014 0750   GFRAA >60 12/15/2014 0313   GFRAA >60 02/06/2014 0750   Lipase  No results found for: LIPASE     Studies/Results: Dg Ercp Biliary & Pancreatic Ducts  12/13/2014   CLINICAL DATA:  Biliary obstructions suggested on ultrasound. Right upper quadrant pain.  EXAM: ERCP  TECHNIQUE: Multiple spot images obtained with the fluoroscopic device and submitted for interpretation post-procedure.  FLUOROSCOPY TIME:  3 minutes 56 seconds  COMPARISON:  Ultrasound 12/11/2014  FINDINGS: Four fluoroscopic spot images document endoscopic cannulation and opacification of the CBD. Intrahepatic ducts are incompletely opacified, appearing decompressed centrally. There is partial opacification of the gallbladder. Passage of a balloon tipped catheter in the distal CBD is documented. No extravasation seen.  IMPRESSION: 1. Endoscopic balloon sweep of common duct. These images were submitted for radiologic interpretation only. Please see the procedural report for the amount of contrast and the fluoroscopy time utilized.   Electronically Signed   By: Lucrezia Europe M.D.   On: 12/13/2014 11:33    Anti-infectives: Anti-infectives    Start     Dose/Rate Route Frequency Ordered Stop   12/12/14  1530  ciprofloxacin (CIPRO) IVPB 400 mg     400 mg 200 mL/hr over 60 Minutes Intravenous Every 12 hours 12/12/14 1529         Assessment/Plan Calculus of bile duct with other cholecystitis and obstruction POD #1 s/p lap chole (NO IOC) S/p ERCP Dr. Benson Norway -Tolerating soft diet -Pain control -No further antibiotics should be needed -Ambulate and IS -Ready to discharge from our perspective.  Follow up arranged.  Post-op instructions in epic.    LOS: 3 days    [name] 12/15/2014, 7:56 AM Pager: [phone]

## 2014-12-15 NOTE — Discharge Summary (Signed)
Physician Discharge Summary  [name] HAL:[id number] DOB: 25-Jul-1975 DOA: 12/12/2014  PCP: Casilda Carls, MD  Admit date: 12/12/2014 Discharge date: 12/15/2014  Time spent: Less than 30 minutes  Recommendations for Outpatient Follow-up:  1. Dr. Casilda Carls, PCP in 1 week with repeat labs (CBC & CMP). 2. [facility]/general surgery on 01/05/15 at 3 PM for postop checkup. Follow gallbladder pathology results as outpatient.  Discharge Diagnoses:  Active Problems:   Choledocholithiasis   UTI (urinary tract infection)   Discharge Condition: Improved & Stable  Diet recommendation: Regular diet.  Filed Weights   12/12/14 1433 12/13/14 1010  Weight: 71.1 kg (156 lb 12 oz) 70.761 kg (156 lb)    History of present illness:  39 year old female with history of HLD, HTN, hypothyroid, obesity status post sleeve gastropathy, presented to [facility] on 12/20/14 with abdominal pain, nausea and abnormal LFTs. Ultrasound abdomen showed choledocholithiasis without cholecystitis. UA suggestive of UTI and patient was started on Cipro. Since [facility] not [facility] GI coverage this weekend, she was transferred to [facility] for evaluation by GI.  Hospital Course:   Cholelithiasis with other cholecystitis/choledocholithiasis and CBD obstruction, s/p Lap Choly 7/4 - Treated supportively with bowel rest, IV fluids and pain management. - GI consulted and patient underwent ERCP with stone extraction on 7/3 - S/P laparoscopic cholecystectomy 12/14/14 by general surgery. - Clinically improved. Surgeons have cleared her for discharge home. - LFTs continued to improve. - Patient has been counseled to take her narcotic pain medications only if absolutely needed otherwise can use OTC Tylenol. She has been advised not to take these medications and drive, or take with alcohol or with other sedative medications. She verbalized understanding.  Presumed UTI - Unfortunately no urine cultures were  sent on admission. - Patient did have dysuria PTA. She has completed at least 3 days of IV Cipro. Symptoms have resolved. DC antibiotics at discharge. Treated for presumed uncomplicated cystitis.  Essential hypertension - Controlled  Hypothyroid -Not on supplements PTA. TSH 02/06/14:0.599.  Anemia - Stable. Outpatient follow-up.  Hypokalemia - Replaced    Consultants:  GI-Dr. Benson Norway  General surgery  Procedures:  ERCP 7/3  Laparoscopic cholecystectomy 7/4  Discharge Exam:  Complaints: Feels much better. Abdomen with appropriate soreness secondary to laparoscopic cholecystectomy. No significant abdominal pain. Flatus + +. No BM yet. Tolerating regular diet. No fever or chills. Denies dysuria or urinary frequency. Anxious to go home.  Filed Vitals:   12/14/14 1415 12/14/14 2124 12/15/14 0113 12/15/14 0629  BP: 130/68 115/73 110/69 109/75  Pulse: 71 78 73 77  Temp: 98.1 F (36.7 C) 99.1 F (37.3 C) 98.7 F (37.1 C) 98.1 F (36.7 C)  TempSrc: Oral Oral Oral Oral  Resp: 17 16 16 17   Height:      Weight:      SpO2: 98% 97% 98% 99%    General exam: Pleasant young female lying comfortably in bed. Respiratory system: Clear. No increased work of breathing. Cardiovascular system: S1 & S2 heard, RRR. No JVD, murmurs, gallops, clicks or pedal edema. Gastrointestinal system: Abdomen is nondistended, soft. Diffuse min appropriate post op tenderness without peritoneal signs . Normal bowel sounds heard. Central nervous system: Alert and oriented. No focal neurological deficits. Extremities: Symmetric 5 x 5 power.  Discharge Instructions      Discharge Instructions    Call MD for:  difficulty breathing, headache or visual disturbances    Complete by:  As directed      Call MD for:  extreme  fatigue    Complete by:  As directed      Call MD for:  hives    Complete by:  As directed      Call MD for:  persistant dizziness or light-headedness    Complete by:  As directed       Call MD for:  persistant nausea and vomiting    Complete by:  As directed      Call MD for:  redness, tenderness, or signs of infection (pain, swelling, redness, odor or green/yellow discharge around incision site)    Complete by:  As directed      Call MD for:  severe uncontrolled pain    Complete by:  As directed      Call MD for:  temperature >100.4    Complete by:  As directed      Diet general    Complete by:  As directed      Increase activity slowly    Complete by:  As directed             Medication List    STOP taking these medications        ciprofloxacin 400 MG/200ML Soln  Commonly known as:  CIPRO     morphine 2 MG/ML injection     ondansetron 4 MG tablet  Commonly known as:  ZOFRAN     oxyCODONE 5 MG immediate release tablet  Commonly known as:  Oxy IR/ROXICODONE      TAKE these medications        dexlansoprazole 60 MG capsule  Commonly known as:  DEXILANT  Take 60 mg by mouth daily as needed (acid reflux).     HYDROcodone-acetaminophen 5-325 MG per tablet  Commonly known as:  NORCO/VICODIN  Take 1-2 tablets by mouth every 6 (six) hours as needed for moderate pain or severe pain.     Vitamin-B Complex Tabs  Take 1 tablet by mouth daily.       Follow-up Information    Follow up with [place] On 01/05/2015.   Why:  For post-operation check. Your appointment is at 3:00, please arrive at least 30 min before your appointment to complete your check in paperwork.  If you are unable to arrive 30 min prior to your appointment time we may have to cancel or reschedule you.   Contact information:   [place] [id number] [phone]      Follow up with [facility], MD. Schedule an appointment as soon as possible for a visit in 1 week.   Specialty:  Internal Medicine   Why:  To be seen with repeat labs (CBC & CMP).   Contact information:   [place] [address] [phone]         The results of significant diagnostics from this hospitalization (including imaging, microbiology, ancillary and laboratory) are listed below for reference.    Significant Diagnostic Studies: Dg Ercp Biliary & Pancreatic Ducts  12/13/2014   CLINICAL DATA:  Biliary obstructions suggested on ultrasound. Right upper quadrant pain.  EXAM: ERCP  TECHNIQUE: Multiple spot images obtained with the fluoroscopic device and submitted for interpretation post-procedure.  FLUOROSCOPY TIME:  3 minutes 56 seconds  COMPARISON:  Ultrasound 12/11/2014  FINDINGS: Four fluoroscopic spot images document endoscopic cannulation and opacification of the CBD. Intrahepatic ducts are incompletely opacified, appearing decompressed centrally. There is partial opacification of the gallbladder. Passage of a balloon tipped catheter in the distal CBD  is documented. No extravasation seen.  IMPRESSION: 1. Endoscopic balloon sweep of common duct. These images were submitted for radiologic interpretation only. Please see the procedural report for the amount of contrast and the fluoroscopy time utilized.   Electronically Signed   By: Lucrezia Europe M.D.   On: 12/13/2014 11:33   US Abdomen Limited Ruq  12/12/2014   CLINICAL DATA:  Acute onset of right upper quadrant abdominal pain. Initial encounter.  EXAM: US ABDOMEN LIMITED - RIGHT UPPER QUADRANT  COMPARISON:  None.  FINDINGS: Gallbladder:  Scattered small stones are seen dependently within the gallbladder. The gallbladder is otherwise unremarkable. No gallbladder wall thickening or pericholecystic fluid is seen. However, a positive ultrasonographic Murphy's sign is elicited.  Common bile duct:  Diameter: 0.8 cm, dilated in appearance.  Liver:  No focal lesion identified. Within normal limits in parenchymal echogenicity. Mild intrahepatic biliary ductal dilatation is seen.  IMPRESSION: Dilatation of the common bile duct and mild intrahepatic biliary ductal dilatation suggests distal common  bile duct obstruction due to a stone. Cholelithiasis noted. Positive ultrasonographic Murphy's sign likely also reflects obstruction. No evidence for cholecystitis.   Electronically Signed   By: Garald Balding M.D.   On: 12/12/2014 00:28    Microbiology: Recent Results (from the past 240 hour(s))  Surgical pcr screen     Status: None   Collection Time: 12/13/14  8:43 PM  Result Value Ref Range Status   MRSA, PCR NEGATIVE NEGATIVE Final   Staphylococcus aureus NEGATIVE NEGATIVE Final    Comment:        The Xpert SA Assay (FDA approved for NASAL specimens in patients over 68 years of age), is one component of a comprehensive surveillance program.  Test performance has been validated by [facility] for patients greater than or equal to 42 year old. It is not intended to diagnose infection nor to guide or monitor treatment.      Labs: Basic Metabolic Panel:  Recent Labs Lab 12/11/14 2218 12/12/14 0411 12/12/14 1855 12/13/14 0345 12/14/14 0330 12/15/14 0313  NA 141 141  --  140 138 140  K 4.2 3.7  --  3.7 3.4* 3.6  CL 107 108  --  107 105 108  CO2 26 28  --  24 24 22   GLUCOSE 104* 90  --  71 77 119*  BUN 7 7  --  <5* <5* <5*  CREATININE 0.69 0.76 1.01* 0.89 0.83 0.91  CALCIUM 9.0 8.6*  --  8.3* 8.2* 8.7*   Liver Function Tests:  Recent Labs Lab 12/11/14 2218 12/12/14 0411 12/13/14 0345 12/14/14 0330 12/15/14 0313  AST 489* 424* 123* 74* 56*  ALT 518* 502* 335* 258* 215*  ALKPHOS 89 89 90 93 94  BILITOT 3.0* 3.1* 1.1 0.8 0.9  PROT 6.8 6.1* 5.0* 5.1* 5.4*  ALBUMIN 3.9 3.4* 2.9* 2.9* 3.1*   No results for input(s): LIPASE, AMYLASE in the last 168 hours. No results for input(s): AMMONIA in the last 168 hours. CBC:  Recent Labs Lab 12/11/14 2218 12/12/14 0411 12/12/14 1855 12/13/14 0345 12/15/14 0313  WBC 7.9 5.6 5.5 4.6 9.0  NEUTROABS 6.4  --   --   --   --   HGB 11.8* 11.1* 11.3* 10.8* 11.7*  HCT 37.2 34.2* 35.5* 34.2* 36.0  MCV 82.7 82.9 82.2 82.0  81.4  PLT 236 196 227 231 212   Cardiac Enzymes:  Recent Labs Lab 12/11/14 0050  CKTOTAL 64  TROPONINI <0.03   BNP: BNP (last  3 results) No results for input(s): BNP in the last 8760 hours.  ProBNP (last 3 results) No results for input(s): PROBNP in the last 8760 hours.  CBG: No results for input(s): GLUCAP in the last 168 hours.    Signed:  Vernell Leep, MD, FACP, FHM. [facility] Pager [phone]  If 7PM-7AM, please contact night-coverage [email or web address] Password [email or web address] 12/15/2014, 12:12 PM

## 2014-12-15 NOTE — Discharge Instructions (Signed)
Your appointment is at 3:00, please arrive at least 30 min before your appointment to complete your check in paperwork.  If you are unable to arrive 30 min prior to your appointment time we may have to cancel or reschedule you.  LAPAROSCOPIC SURGERY: POST OP INSTRUCTIONS  1. DIET: Follow a light bland diet the first 24 hours after arrival home, such as soup, liquids, crackers, etc. Be sure to include lots of fluids daily. Avoid fast food or heavy meals as your are more likely to get nauseated. Eat a low fat the next few days after surgery.  2. Take your usually prescribed home medications unless otherwise directed. 3. PAIN CONTROL:  1. Pain is best controlled by a usual combination of three different methods TOGETHER:  1. Ice/Heat 2. Over the counter pain medication 3. Prescription pain medication 2. Most patients will experience some swelling and bruising around the incisions. Ice packs or heating pads (30-60 minutes up to 6 times a day) will help. Use ice for the first few days to help decrease swelling and bruising, then switch to heat to help relax tight/sore spots and speed recovery. Some people prefer to use ice alone, heat alone, alternating between ice & heat. Experiment to what works for you. Swelling and bruising can take several weeks to resolve.  3. It is helpful to take an over-the-counter pain medication regularly for the first few weeks. Choose one of the following that works best for you:  1. Naproxen (Aleve, etc) Two 220mg  tabs twice a day 2. Ibuprofen (Advil, etc) Three 200mg  tabs four times a day (every meal & bedtime) 3. Acetaminophen (Tylenol, etc) 500-650mg  four times a day (every meal & bedtime) 4. A prescription for pain medication (such as oxycodone, hydrocodone, etc) should be given to you upon discharge. Take your pain medication as prescribed.  1. If you are having problems/concerns with the prescription medicine (does not control pain, nausea, vomiting, rash, itching,  etc), please call us [phone] to see if we need to switch you to a different pain medicine that will work better for you and/or control your side effect better. 2. If you need a refill on your pain medication, please contact your pharmacy. They will contact our office to request authorization. Prescriptions will not be filled after 5 pm or on week-ends. 4. Avoid getting constipated. Between the surgery and the pain medications, it is common to experience some constipation. Increasing fluid intake and taking a fiber supplement (such as Metamucil, Citrucel, FiberCon, MiraLax, etc) 1-2 times a day regularly will usually help prevent this problem from occurring. A mild laxative (prune juice, Milk of Magnesia, MiraLax, etc) should be taken according to package directions if there are no bowel movements after 48 hours.  5. Watch out for diarrhea. If you have many loose bowel movements, simplify your diet to bland foods & liquids for a few days. Stop any stool softeners and decrease your fiber supplement. Switching to mild anti-diarrheal medications (Kayopectate, Pepto Bismol) can help. If this worsens or does not improve, please call us. 6. Wash / shower every day. You may shower over the dressings as they are waterproof. Continue to shower over incision(s) after the dressing is off. 7. Remove your waterproof bandages 5 days after surgery. You may leave the incision open to air. You may replace a dressing/Band-Aid to cover the incision for comfort if you wish.  8. ACTIVITIES as tolerated:  1. You may resume regular (light) daily activities beginning the next day--such as  daily self-care, walking, climbing stairs--gradually increasing activities as tolerated. If you can walk 30 minutes without difficulty, it is safe to try more intense activity such as jogging, treadmill, bicycling, low-impact aerobics, swimming, etc. 2. Save the most intensive and strenuous activity for last such as sit-ups, heavy lifting,  contact sports, etc Refrain from any heavy lifting or straining until you are off narcotics for pain control.  3. DO NOT PUSH THROUGH PAIN. Let pain be your guide: If it hurts to do something, don't do it. Pain is your body warning you to avoid that activity for another week until the pain goes down. 4. You may drive when you are no longer taking prescription pain medication, you can comfortably wear a seatbelt, and you can safely maneuver your car and apply brakes. 5. You may have sexual intercourse when it is comfortable.  9. FOLLOW UP in our office  1. Please call [facility] at [phone] to set up an appointment to see your surgeon in the office for a follow-up appointment approximately 2-3 weeks after your surgery. 2. Make sure that you call for this appointment the day you arrive home to insure a convenient appointment time.      10. IF YOU HAVE DISABILITY OR FAMILY LEAVE FORMS, BRING THEM TO THE               OFFICE FOR PROCESSING.   WHEN TO CALL us [phone]:  1. Poor pain control 2. Reactions / problems with new medications (rash/itching, nausea, etc)  3. Fever over 101.5 F (38.5 C) 4. Inability to urinate 5. Nausea and/or vomiting 6. Worsening swelling or bruising 7. Continued bleeding from incision. 8. Increased pain, redness, or drainage from the incision  The clinic staff is available to answer your questions during regular business hours (8:30am-5pm). Please dont hesitate to call and ask to speak to one of our nurses for clinical concerns.  If you have a medical emergency, go to the nearest emergency room or call 911.  A surgeon from [facility] is always on call at the [facility], [place], [place], [place], [place] [address] ?  MAIN: [phone] ? TOLL FREE: [phone] ?  FAX [phone]  [email or web address]   Cholelithiasis Cholelithiasis (also called gallstones) is a form of gallbladder  disease in which gallstones form in your gallbladder. The gallbladder is an organ that stores bile made in the liver, which helps digest fats. Gallstones begin as small crystals and slowly grow into stones. Gallstone pain occurs when the gallbladder spasms and a gallstone is blocking the duct. Pain can also occur when a stone passes out of the duct.  RISK FACTORS  Being female.   Having multiple pregnancies. Health care providers sometimes advise removing diseased gallbladders before future pregnancies.   Being obese.  Eating a diet heavy in fried foods and fat.   Being older than 59 years and increasing age.   Prolonged use of medicines containing female hormones.   Having diabetes mellitus.   Rapidly losing weight.   Having a family history of gallstones (heredity).  SYMPTOMS  Nausea.   Vomiting.  Abdominal pain.   Yellowing of the skin (jaundice).   Sudden pain. It may persist from several minutes to several hours.  Fever.   Tenderness to the touch. In some cases, when gallstones do not move into the bile duct, people have no pain or symptoms. These are called "silent" gallstones.  TREATMENT  Silent gallstones do not need treatment. In severe cases, emergency surgery may be required. Options for treatment include:  Surgery to remove the gallbladder. This is the most common treatment.  Medicines. These do not always work and may take 6-12 months or more to work.  Shock wave treatment (extracorporeal biliary lithotripsy). In this treatment an ultrasound machine sends shock waves to the gallbladder to break gallstones into smaller pieces that can pass into the intestines or be dissolved by medicine. HOME CARE INSTRUCTIONS   Only take over-the-counter or prescription medicines for pain, discomfort, or fever as directed by your health care provider.   Follow a low-fat diet until seen again by your health care provider. Fat causes the gallbladder to contract,  which can result in pain.   Follow up with your health care provider as directed. Attacks are almost always recurrent and surgery is usually required for permanent treatment.  SEEK IMMEDIATE MEDICAL CARE IF:   Your pain increases and is not controlled by medicines.   You have a fever or persistent symptoms for more than 2-3 days.   You have a fever and your symptoms suddenly get worse.   You have persistent nausea and vomiting.  MAKE SURE YOU:   Understand these instructions.  Will watch your condition.  Will get help right away if you are not doing well or get worse. Document Released: 05/25/2005 Document Revised: 01/29/2013 Document Reviewed: 11/20/2012 [facility] Patient Information 2015 [place], [place]. This information is not intended to replace advice given to you by your health care provider. Make sure you discuss any questions you have with your health care provider.

## 2017-04-18 ENCOUNTER — Other Ambulatory Visit: Payer: Self-pay | Admitting: Ophthalmology

## 2017-04-18 ENCOUNTER — Other Ambulatory Visit: Payer: Self-pay | Admitting: Internal Medicine

## 2017-04-18 DIAGNOSIS — R102 Pelvic and perineal pain: Secondary | ICD-10-CM

## 2017-04-18 DIAGNOSIS — R1084 Generalized abdominal pain: Secondary | ICD-10-CM

## 2017-04-20 ENCOUNTER — Ambulatory Visit
Admission: RE | Admit: 2017-04-20 | Discharge: 2017-04-20 | Disposition: A | Discharge status: Home / Self Care | Payer: 59 | Source: Ambulatory Visit | Attending: Ophthalmology | Admitting: Ophthalmology

## 2017-04-20 DIAGNOSIS — R1084 Generalized abdominal pain: Secondary | ICD-10-CM

## 2017-04-20 DIAGNOSIS — R102 Pelvic and perineal pain: Secondary | ICD-10-CM | POA: Diagnosis not present

## 2017-04-20 DIAGNOSIS — Z9049 Acquired absence of other specified parts of digestive tract: Secondary | ICD-10-CM | POA: Insufficient documentation

## 2017-04-20 DIAGNOSIS — R109 Unspecified abdominal pain: Secondary | ICD-10-CM | POA: Insufficient documentation

## 2017-04-20 DIAGNOSIS — R609 Edema, unspecified: Secondary | ICD-10-CM | POA: Insufficient documentation

## 2017-04-26 ENCOUNTER — Other Ambulatory Visit: Payer: Self-pay | Admitting: Internal Medicine

## 2017-04-26 DIAGNOSIS — R102 Pelvic and perineal pain: Secondary | ICD-10-CM

## 2017-04-26 DIAGNOSIS — R1084 Generalized abdominal pain: Secondary | ICD-10-CM

## 2017-04-27 ENCOUNTER — Ambulatory Visit: Admission: RE | Admit: 2017-04-27 | Payer: 59 | Source: Ambulatory Visit

## 2017-07-13 ENCOUNTER — Ambulatory Visit: Payer: 59

## 2017-07-13 ENCOUNTER — Other Ambulatory Visit: Payer: Self-pay | Admitting: Internal Medicine

## 2017-07-13 DIAGNOSIS — R1084 Generalized abdominal pain: Secondary | ICD-10-CM

## 2017-07-16 ENCOUNTER — Ambulatory Visit: Payer: Commercial Managed Care - HMO

## 2017-07-16 ENCOUNTER — Other Ambulatory Visit: Payer: Commercial Managed Care - HMO

## 2017-07-17 ENCOUNTER — Ambulatory Visit
Admission: RE | Admit: 2017-07-17 | Discharge: 2017-07-17 | Disposition: A | Discharge status: Home / Self Care | Payer: 59 | Source: Ambulatory Visit | Attending: Internal Medicine | Admitting: Internal Medicine

## 2017-07-17 ENCOUNTER — Other Ambulatory Visit: Payer: Commercial Managed Care - HMO

## 2017-07-17 DIAGNOSIS — R1084 Generalized abdominal pain: Secondary | ICD-10-CM

## 2017-07-17 MED ORDER — IOPAMIDOL (ISOVUE-300) INJECTION 61%
100.0000 mL | Freq: Once | INTRAVENOUS | Status: AC | PRN
  Administered 2017-07-17: 100 mL via INTRAVENOUS

## 2017-09-26 ENCOUNTER — Encounter (HOSPITAL_COMMUNITY): Payer: Self-pay | Admitting: Family Medicine

## 2017-09-26 ENCOUNTER — Ambulatory Visit (HOSPITAL_COMMUNITY)
Admission: EM | Admit: 2017-09-26 | Discharge: 2017-09-26 | Disposition: A | Discharge status: Home / Self Care | Payer: 59 | Attending: Urgent Care | Admitting: Urgent Care

## 2017-09-26 DIAGNOSIS — R059 Cough, unspecified: Secondary | ICD-10-CM

## 2017-09-26 DIAGNOSIS — R0989 Other specified symptoms and signs involving the circulatory and respiratory systems: Secondary | ICD-10-CM

## 2017-09-26 DIAGNOSIS — R05 Cough: Secondary | ICD-10-CM

## 2017-09-26 DIAGNOSIS — J209 Acute bronchitis, unspecified: Secondary | ICD-10-CM

## 2017-09-26 DIAGNOSIS — R0602 Shortness of breath: Secondary | ICD-10-CM

## 2017-09-26 DIAGNOSIS — R0789 Other chest pain: Secondary | ICD-10-CM

## 2017-09-26 DIAGNOSIS — R0981 Nasal congestion: Secondary | ICD-10-CM

## 2017-09-26 MED ORDER — CETIRIZINE HCL 10 MG PO TABS: 10.0000 mg | ORAL_TABLET | Freq: Every day | ORAL | 11 refills | Status: DC

## 2017-09-26 MED ORDER — PREDNISONE 20 MG PO TABS: ORAL_TABLET | ORAL | 0 refills | Status: DC

## 2017-09-26 MED ORDER — AZITHROMYCIN 250 MG PO TABS: 250.0000 mg | ORAL_TABLET | Freq: Every day | ORAL | 0 refills | Status: DC

## 2017-09-26 MED ORDER — HYDROCODONE-HOMATROPINE 5-1.5 MG/5ML PO SYRP: 5.0000 mL | ORAL_SOLUTION | Freq: Every evening | ORAL | 0 refills | Status: DC | PRN

## 2017-09-26 MED ORDER — AZITHROMYCIN 250 MG PO TABS: ORAL_TABLET | ORAL | 0 refills | Status: DC

## 2017-09-26 MED ORDER — BENZONATATE 100 MG PO CAPS: 100.0000 mg | ORAL_CAPSULE | Freq: Three times a day (TID) | ORAL | 0 refills | Status: DC | PRN

## 2017-09-26 NOTE — ED Triage Notes (Signed)
Pt here for 2 weeks of cough, congestion, wheezing, drainage and mucous.

## 2017-09-26 NOTE — ED Provider Notes (Signed)
  MRN: [id number] DOB: Jan 13, 1976  Subjective:   [name] is a 42 y.o. female presenting for 2-week history of persistent productive cough that elicits chest pain, shortness of breath, wheezing.  Patient also has chest congestion, runny nose, sore throat.  She has been using over-the-counter cold and flu medications with minimal relief.  She has a history of allergies but is not taking any medications for this.  Denies smoking cigarettes.  She has a history of gastric sleeve but admits that she is used prednisone after her surgery and has done okay with this.  No current facility-administered medications for this encounter.   Current Outpatient Medications:  .  B Complex Vitamins (VITAMIN-B COMPLEX) TABS, Take 1 tablet by mouth daily., Disp: , Rfl:    No Known Allergies  Past Medical History:  Diagnosis Date  . Hyperglycemia   . Hyperlipidemia   . Hypertension   . Hypothyroidism   . Obesity   . Thyroid mass      Past Surgical History:  Procedure Laterality Date  . CESAREAN SECTION    . CHOLECYSTECTOMY N/A 12/14/2014   Procedure: LAPAROSCOPIC CHOLECYSTECTOMY;  Surgeon: Donnie Mesa, MD;  Location: [place];  Service: General;  Laterality: N/A;  . ERCP N/A 12/13/2014   Procedure: ENDOSCOPIC RETROGRADE CHOLANGIOPANCREATOGRAPHY (ERCP);  Surgeon: Carol Ada, MD;  Location: [facility] ENDOSCOPY;  Service: Endoscopy;  Laterality: N/A;  . PARTIAL HYSTERECTOMY    . SLEEVE GASTROPLASTY      Objective:   Vitals: BP 134/90   Pulse 70   Temp 98.3 F (36.8 C)   Resp 18   SpO2 100%   Physical Exam  Constitutional: She is oriented to person, place, and time. She appears well-developed and well-nourished.  HENT:  Throat with thick streaks of postnasal drainage.  No sinus tenderness.  Eyes: Right eye exhibits no discharge. Left eye exhibits no discharge. No scleral icterus.  Cardiovascular: Normal rate, regular rhythm and intact distal pulses. Exam reveals no gallop and no friction  rub.  No murmur heard. Pulmonary/Chest: No stridor. No respiratory distress. She has no wheezes. She has no rales.  Rhonchorous lung sounds throughout.  Neurological: She is alert and oriented to person, place, and time.  Skin: Skin is warm and dry.  Psychiatric: She has a normal mood and affect.   Assessment and Plan :   Acute bronchitis, unspecified organism  Cough  Shortness of breath  Chest congestion  Sinus congestion  Atypical chest pain  Patient is to start azithromycin, short steroid course.  She is also to use Zyrtec, hydrate well.  Cough suppression medications offered. Counseled patient on potential for adverse effects with medications prescribed today, patient verbalized understanding. Return-to-clinic precautions discussed, patient verbalized understanding.      Jaynee Eagles, PA-C 09/26/17 1028

## 2018-06-16 ENCOUNTER — Ambulatory Visit (HOSPITAL_COMMUNITY)
Admission: EM | Admit: 2018-06-16 | Discharge: 2018-06-16 | Disposition: A | Discharge status: Home / Self Care | Payer: 59 | Source: Home / Self Care

## 2018-06-16 ENCOUNTER — Emergency Department (HOSPITAL_BASED_OUTPATIENT_CLINIC_OR_DEPARTMENT_OTHER)
Admission: EM | Admit: 2018-06-16 | Discharge: 2018-06-16 | Disposition: A | Discharge status: Home / Self Care | Payer: 59 | Attending: Emergency Medicine | Admitting: Emergency Medicine

## 2018-06-16 ENCOUNTER — Other Ambulatory Visit: Payer: Self-pay

## 2018-06-16 ENCOUNTER — Emergency Department (HOSPITAL_BASED_OUTPATIENT_CLINIC_OR_DEPARTMENT_OTHER): Payer: 59

## 2018-06-16 ENCOUNTER — Encounter (HOSPITAL_BASED_OUTPATIENT_CLINIC_OR_DEPARTMENT_OTHER): Payer: Self-pay

## 2018-06-16 DIAGNOSIS — I10 Essential (primary) hypertension: Secondary | ICD-10-CM | POA: Insufficient documentation

## 2018-06-16 DIAGNOSIS — R1032 Left lower quadrant pain: Secondary | ICD-10-CM | POA: Diagnosis present

## 2018-06-16 DIAGNOSIS — K625 Hemorrhage of anus and rectum: Secondary | ICD-10-CM | POA: Insufficient documentation

## 2018-06-16 DIAGNOSIS — E039 Hypothyroidism, unspecified: Secondary | ICD-10-CM | POA: Insufficient documentation

## 2018-06-16 DIAGNOSIS — N201 Calculus of ureter: Secondary | ICD-10-CM | POA: Insufficient documentation

## 2018-06-16 DIAGNOSIS — Z79899 Other long term (current) drug therapy: Secondary | ICD-10-CM | POA: Insufficient documentation

## 2018-06-16 LAB — OCCULT BLOOD X 1 CARD TO LAB, STOOL: Fecal Occult Bld: POSITIVE — AB

## 2018-06-16 LAB — COMPREHENSIVE METABOLIC PANEL
ALBUMIN: 4.2 g/dL (ref 3.5–5.0)
ALT: 21 U/L (ref 0–44)
AST: 22 U/L (ref 15–41)
Alkaline Phosphatase: 40 U/L (ref 38–126)
Anion gap: 7 (ref 5–15)
BUN: 11 mg/dL (ref 6–20)
CHLORIDE: 106 mmol/L (ref 98–111)
CO2: 24 mmol/L (ref 22–32)
CREATININE: 0.99 mg/dL (ref 0.44–1.00)
Calcium: 8.8 mg/dL — ABNORMAL LOW (ref 8.9–10.3)
GFR calc Af Amer: >60 mL/min (ref >60)
GLUCOSE: 114 mg/dL — AB (ref 70–99)
Potassium: 3.9 mmol/L (ref 3.5–5.1)
Sodium: 137 mmol/L (ref 135–145)
Total Bilirubin: 0.7 mg/dL (ref 0.3–1.2)
Total Protein: 7.2 g/dL (ref 6.5–8.1)

## 2018-06-16 LAB — URINALYSIS, ROUTINE W REFLEX MICROSCOPIC
Bilirubin Urine: NEGATIVE
GLUCOSE, UA: NEGATIVE mg/dL
Ketones, ur: NEGATIVE mg/dL
LEUKOCYTES UA: NEGATIVE
Nitrite: NEGATIVE
PROTEIN: 30 mg/dL — AB
pH: 5.5 (ref 5.0–8.0)

## 2018-06-16 LAB — CBC
HEMATOCRIT: 40.1 % (ref 36.0–46.0)
Hemoglobin: 12.4 g/dL (ref 12.0–15.0)
MCH: 26.2 pg (ref 26.0–34.0)
MCHC: 30.9 g/dL (ref 30.0–36.0)
MCV: 84.8 fL (ref 80.0–100.0)
Platelets: 276 10*3/uL (ref 150–400)
RBC: 4.73 MIL/uL (ref 3.87–5.11)
RDW: 13.3 % (ref 11.5–15.5)
WBC: 15.7 10*3/uL — AB (ref 4.0–10.5)
nRBC: 0 % (ref 0.0–0.2)

## 2018-06-16 LAB — URINALYSIS, MICROSCOPIC (REFLEX)
RBC / HPF: >50 RBC/hpf (ref 0–5)
WBC UA: NONE SEEN WBC/hpf (ref 0–5)

## 2018-06-16 LAB — LIPASE, BLOOD: LIPASE: 30 U/L (ref 11–51)

## 2018-06-16 LAB — PREGNANCY, URINE: PREG TEST UR: NEGATIVE

## 2018-06-16 MED ORDER — ONDANSETRON 4 MG PO TBDP
4.0000 mg | ORAL_TABLET | Freq: Once | ORAL | Status: AC | PRN
Start: 2018-06-16 — End: 2018-06-16
  Administered 2018-06-16: 4 mg via ORAL
  Filled 2018-06-16: qty 1

## 2018-06-16 MED ORDER — IOPAMIDOL (ISOVUE-300) INJECTION 61%
100.0000 mL | Freq: Once | INTRAVENOUS | Status: AC | PRN
  Administered 2018-06-16: 100 mL via INTRAVENOUS

## 2018-06-16 MED ORDER — MORPHINE SULFATE (PF) 4 MG/ML IV SOLN
4.0000 mg | Freq: Once | INTRAVENOUS | Status: AC
  Administered 2018-06-16: 4 mg via INTRAVENOUS
  Filled 2018-06-16: qty 1

## 2018-06-16 MED ORDER — ONDANSETRON HCL 4 MG/2ML IJ SOLN
4.0000 mg | Freq: Once | INTRAMUSCULAR | Status: AC
Start: 2018-06-16 — End: 2018-06-16
  Administered 2018-06-16: 4 mg via INTRAVENOUS
  Filled 2018-06-16: qty 2

## 2018-06-16 MED ORDER — SODIUM CHLORIDE 0.9 % IV BOLUS
500.0000 mL | Freq: Once | INTRAVENOUS | Status: AC
  Administered 2018-06-16: 500 mL via INTRAVENOUS

## 2018-06-16 NOTE — Discharge Instructions (Addendum)
You were given a referral to the gastroenterology doctor.  Please call the office to make an appointment for follow-up.  Please follow up with your primary care provider within 5-7 days for re-evaluation of your symptoms. If you do not have a primary care provider, information for a healthcare clinic has been provided for you to make arrangements for follow up care. Please return to the emergency department for any new or worsening symptoms.

## 2018-06-16 NOTE — ED Notes (Signed)
Patient given ginger ale at this time for fluid challenge.

## 2018-06-16 NOTE — ED Notes (Signed)
Pt c/o N/V/D that started today. Pt c/o LLQ abdominal pain that she rates 9/10. States she is passing ablood in her stool. Pt has taken no medications prior to arrival.

## 2018-06-16 NOTE — ED Provider Notes (Signed)
[name] EMERGENCY DEPARTMENT Provider Note   CSN: [id number] Arrival date & time: 06/16/18  1405     History   Chief Complaint Chief Complaint  Patient presents with  . Abdominal Pain    HPI [name] is a 43 y.o. female.  HPI   Pt is 43 y/o female with a h/o laparoscopic gastric bypass (Dr. Boykin Reaper 2015), cholecystectomy, hysterectomy who presents to the ED today c/o LLQ abd pain that began when she woke up. Pain has been waxing and waning since onset and has been worsening. Pain rated 7-8/10 currently. Pain described as dull. Pain radiates to the left flank, mid abdomen, and suprapubic area.  She reports she has had diarrhea and multiple bloody stools today. She states that initially she had minimal amounts of blood but since then her BMs have been only blood. States blood is bright red. Denies any recent known constipation. Reports chills, but denies documented fevers. Also reports nausea, vomiting. Denies urinary or vaginal complaints.   Patient has had colonoscopy in the past but was not told if she had diverticulosis.  Past Medical History:  Diagnosis Date  . Hyperglycemia   . Hyperlipidemia   . Hypertension   . Hypothyroidism   . Obesity   . Thyroid mass     Patient Active Problem List   Diagnosis Date Noted  . Choledocholithiasis 12/12/2014  . UTI (urinary tract infection) 12/12/2014    Past Surgical History:  Procedure Laterality Date  . CESAREAN SECTION    . CHOLECYSTECTOMY N/A 12/14/2014   Procedure: LAPAROSCOPIC CHOLECYSTECTOMY;  Surgeon: Donnie Mesa, MD;  Location: [place];  Service: General;  Laterality: N/A;  . ERCP N/A 12/13/2014   Procedure: ENDOSCOPIC RETROGRADE CHOLANGIOPANCREATOGRAPHY (ERCP);  Surgeon: Carol Ada, MD;  Location: [facility] ENDOSCOPY;  Service: Endoscopy;  Laterality: N/A;  . PARTIAL HYSTERECTOMY    . SLEEVE GASTROPLASTY       OB History   No obstetric history on file.      Home Medications    Prior to  Admission medications   Medication Sig Start Date End Date Taking? Authorizing Provider  azithromycin (ZITHROMAX) 250 MG tablet Start with 2 tablets today, then 1 daily thereafter. 09/26/17   Jaynee Eagles, PA-C  B Complex Vitamins (VITAMIN-B COMPLEX) TABS Take 1 tablet by mouth daily.    [provider]  benzonatate (TESSALON) 100 MG capsule Take 1-2 capsules (100-200 mg total) by mouth 3 (three) times daily as needed. 09/26/17   Jaynee Eagles, PA-C  cetirizine (ZYRTEC ALLERGY) 10 MG tablet Take 1 tablet (10 mg total) by mouth daily. 09/26/17   Jaynee Eagles, PA-C  HYDROcodone-homatropine [facility]) 5-1.5 MG/5ML syrup Take 5 mLs by mouth at bedtime as needed. 09/26/17   Jaynee Eagles, PA-C  predniSONE (DELTASONE) 20 MG tablet Take 2 tablets daily with breakfast. 09/26/17   Jaynee Eagles, PA-C    Family History Family History  Problem Relation Age of Onset  . Hypertension Mother   . Hyperlipidemia Mother   . Hypertension Father   . Hyperlipidemia Father   . Heart attack Paternal Aunt 60  . Heart attack Paternal Aunt 36    Social History Social History   Tobacco Use  . Smoking status: Never Smoker  . Smokeless tobacco: Never Used  Substance Use Topics  . Alcohol use: Yes    Comment: occas wine.  . Drug use: No     Allergies   Patient has no known allergies.   Review of Systems Review of Systems  Constitutional: Positive for chills. Negative for fever.  HENT: Negative for ear pain and sore throat.   Eyes: Negative for pain and visual disturbance.  Respiratory: Negative for cough and shortness of breath.   Cardiovascular: Negative for chest pain.  Gastrointestinal: Positive for abdominal pain, blood in stool, diarrhea, nausea and vomiting. Negative for constipation.  Genitourinary: Negative for dysuria, frequency, hematuria and urgency.  Musculoskeletal: Negative for back pain.  Skin: Negative for rash.  Neurological: Negative for headaches.  All other systems reviewed and are  negative.   Physical Exam Updated Vital Signs BP 128/88 (BP Location: Right Arm)   Pulse 71   Temp 97.6 F (36.4 C) (Oral)   Resp 20   Ht 5\' 5"  (1.651 m)   Wt 73 kg   SpO2 100%   BMI 26.79 kg/m   Physical Exam Vitals signs and nursing note reviewed.  Constitutional:      General: She is in acute distress.     Appearance: She is well-developed.  HENT:     Head: Normocephalic and atraumatic.  Eyes:     Conjunctiva/sclera: Conjunctivae normal.  Neck:     Musculoskeletal: Neck supple.  Cardiovascular:     Rate and Rhythm: Normal rate and regular rhythm.     Heart sounds: Normal heart sounds. No murmur.  Pulmonary:     Effort: Pulmonary effort is normal. No respiratory distress.     Breath sounds: Normal breath sounds. No stridor. No wheezing or rhonchi.  Abdominal:     General: Bowel sounds are normal.     Palpations: Abdomen is soft.     Tenderness: There is abdominal tenderness in the left upper quadrant and left lower quadrant. There is guarding. There is no right CVA tenderness, left CVA tenderness or rebound.  Genitourinary:    Comments: Rectal exam performed with chaperone present.  No gross blood or obvious external hemorrhoids.  No internal hemorrhoids or pain with digital rectal exam.  No gross blood on exam, no obvious stool in the vault. Skin:    General: Skin is warm and dry.  Neurological:     Mental Status: She is alert.  Psychiatric:        Mood and Affect: Mood is anxious.   ED Treatments / Results  Labs (all labs ordered are listed, but only abnormal results are displayed) Labs Reviewed  COMPREHENSIVE METABOLIC PANEL - Abnormal; Notable for the following components:      Result Value   Glucose, Bld 114 (*)    Calcium 8.8 (*)    All other components within normal limits  CBC - Abnormal; Notable for the following components:   WBC 15.7 (*)    All other components within normal limits  URINALYSIS, ROUTINE W REFLEX MICROSCOPIC - Abnormal; Notable  for the following components:   Specific Gravity, Urine >1.030 (*)    Hgb urine dipstick LARGE (*)    Protein, ur 30 (*)    All other components within normal limits  OCCULT BLOOD X 1 CARD TO LAB, STOOL - Abnormal; Notable for the following components:   Fecal Occult Bld POSITIVE (*)    All other components within normal limits  URINALYSIS, MICROSCOPIC (REFLEX) - Abnormal; Notable for the following components:   Bacteria, UA MANY (*)    All other components within normal limits  LIPASE, BLOOD  PREGNANCY, URINE  POC OCCULT BLOOD, ED    EKG None  Radiology Ct Abdomen Pelvis W Contrast  Result Date: 06/16/2018 CLINICAL DATA:  Left  lower quadrant pain and blood in stool for "awhile", worse today. EXAM: CT ABDOMEN AND PELVIS WITH CONTRAST TECHNIQUE: Multidetector CT imaging of the abdomen and pelvis was performed using the standard protocol following bolus administration of intravenous contrast. CONTRAST:  100 mL ISOVUE-300 IOPAMIDOL (ISOVUE-300) INJECTION 61% COMPARISON:  CT abdomen and pelvis 07/17/2017. FINDINGS: Lower chest: Lung bases clear. No pleural or pericardial effusion. Heart size is normal. Hepatobiliary: No focal liver abnormality is seen. Status post cholecystectomy. No biliary dilatation. Pancreas: Unremarkable. No pancreatic ductal dilatation or surrounding inflammatory changes. Spleen: Normal in size without focal abnormality. Adrenals/Urinary Tract: No hydronephrosis. The left ureter is mildly dilated with slight stranding about it. A 0.4 cm stone is seen lying dependently in the urinary bladder. No other urinary tract stones are identified. The kidneys appear normal. Ureters are unremarkable. Stomach/Bowel: Stomach is within normal limits. Appendix appears normal. No evidence of bowel wall thickening, distention, or inflammatory changes. The patient is status sleeve gastroplasty. Vascular/Lymphatic: No significant vascular findings are present. No enlarged abdominal or pelvic  lymph nodes. Reproductive: Status post hysterectomy. No adnexal masses. Other: None. Musculoskeletal: No acute or focal abnormality. IMPRESSION: 0.4 cm stone in the urinary bladder. Minimal dilatation of the left ureter and mild stranding about the ureter are consistent with recent stone passage. No hydronephrosis. No other urinary tract stones. No finding to explain the patient's blood in stool. Electronically Signed   By: Inge Rise M.D.   On: 06/16/2018 17:05    Procedures Procedures (including critical care time)  Medications Ordered in ED Medications  ondansetron (ZOFRAN-ODT) disintegrating tablet 4 mg (4 mg Oral Given 06/16/18 1419)  morphine 4 MG/ML injection 4 mg (4 mg Intravenous Given 06/16/18 1454)  sodium chloride 0.9 % bolus 500 mL (0 mLs Intravenous Stopped 06/16/18 1642)  morphine 4 MG/ML injection 4 mg (4 mg Intravenous Given 06/16/18 1550)  ondansetron (ZOFRAN) injection 4 mg (4 mg Intravenous Given 06/16/18 1550)  iopamidol (ISOVUE-300) 61 % injection 100 mL (100 mLs Intravenous Contrast Given 06/16/18 1643)     Initial Impression / Assessment and Plan / ED Course  I have reviewed the triage vital signs and the nursing notes.  Pertinent labs & imaging results that were available during my care of the patient were reviewed by me and considered in my medical decision making (see chart for details).     Final Clinical Impressions(s) / ED Diagnoses   Final diagnoses:  Ureteral stone  Bright red blood per rectum   Presenting the ED today complaining of left lower quadrant abdominal pain radiating to the left flank and suprapubically.  Also associated with nausea vomiting diarrhea and bloody stools.  Afebrile, normal vital signs.  On exam she does have tenderness to the left lower quadrant.  CBC shows leukocytosis of 15.7, normal hgb.  CMP shows no gross electrolyte derangement.  Normal kidney and liver function.  Lipase is negative.  UA with hematuria and proteinuria.  No  leukocytes or nitrites present.  Pregnancy test negative.  Equal occult blood test is positive however on digital rectal exam no gross blood was noted.  No external hemorrhoids were noted.  CT scan of the abdomen pelvis was completed which showed 0.4 cm stone in the urinary bladder. Minimal dilatation of the left ureter and mild stranding about the ureter are consistent with recent stone passage. No hydronephrosis. No other urinary tract stones. No finding to explain the patient's blood in stool.  Suspect the patient's symptoms were secondary to recently passed stone.  With regards to her bloody stools, this is of unclear etiology.  She may have internal hemorrhoids causing bleeding.  Given that her hemoglobin is stable and she has had no continued bowel movements that are bloody in the ED will have her follow-up with GI as an outpatient.  On reevaluation she states her pain has improved.  Repeat abdominal exam feels nonsurgical abdomen.  Gave strict return precautions for new or worsening symptoms in the meantime.  She voices understanding the plan reasons return the ED.  All questions answered.  ED Discharge Orders    None       Bishop Dublin 06/16/18 1755    Margette Fast, MD 06/17/18 1730

## 2018-06-16 NOTE — ED Triage Notes (Signed)
L side abd pain and blood in stool that started this AM. Pain worse with standing. Pt has associated N/V.

## 2018-06-16 NOTE — ED Notes (Signed)
Pt here for severe abdominal pain, hx of abdominal surgery gastric bypass, pt states this morning she woke up and had a bowel movement that had streaks of blood in it and now her bowel movements are just blood. Pt states she feels worse, now is vomiting. Pt needs eval in ER, agreeable to plan, given info about shortest waits in the ER. Left with family

## 2018-06-16 NOTE — ED Notes (Signed)
Pt taken to CT scan.

## 2018-06-18 ENCOUNTER — Encounter: Payer: Self-pay | Admitting: Gastroenterology

## 2018-06-21 ENCOUNTER — Ambulatory Visit: Payer: 59 | Admitting: Gastroenterology

## 2018-06-28 ENCOUNTER — Encounter

## 2018-06-28 ENCOUNTER — Other Ambulatory Visit (INDEPENDENT_AMBULATORY_CARE_PROVIDER_SITE_OTHER): Payer: 59

## 2018-06-28 ENCOUNTER — Ambulatory Visit: Payer: 59 | Admitting: Gastroenterology

## 2018-06-28 ENCOUNTER — Encounter: Payer: Self-pay | Admitting: Gastroenterology

## 2018-06-28 VITALS — BP 128/82 | HR 80 | Ht 64.5 in | Wt 167.0 lb

## 2018-06-28 DIAGNOSIS — K625 Hemorrhage of anus and rectum: Secondary | ICD-10-CM | POA: Diagnosis not present

## 2018-06-28 LAB — CBC WITH DIFFERENTIAL/PLATELET
BASOS ABS: 0.1 10*3/uL (ref 0.0–0.1)
Basophils Relative: 0.7 % (ref 0.0–3.0)
EOS ABS: 0 10*3/uL (ref 0.0–0.7)
Eosinophils Relative: 0.5 % (ref 0.0–5.0)
HEMATOCRIT: 38.8 % (ref 36.0–46.0)
HEMOGLOBIN: 12.6 g/dL (ref 12.0–15.0)
LYMPHS PCT: 30.7 % (ref 12.0–46.0)
Lymphs Abs: 2.7 10*3/uL (ref 0.7–4.0)
MCHC: 32.4 g/dL (ref 30.0–36.0)
MCV: 81.8 fl (ref 78.0–100.0)
MONOS PCT: 6.1 % (ref 3.0–12.0)
Monocytes Absolute: 0.5 10*3/uL (ref 0.1–1.0)
NEUTROS ABS: 5.3 10*3/uL (ref 1.4–7.7)
Neutrophils Relative %: 62 % (ref 43.0–77.0)
PLATELETS: 221 10*3/uL (ref 150.0–400.0)
RBC: 4.74 Mil/uL (ref 3.87–5.11)
RDW: 13.9 % (ref 11.5–15.5)
WBC: 8.6 10*3/uL (ref 4.0–10.5)

## 2018-06-28 MED ORDER — NA SULFATE-K SULFATE-MG SULF 17.5-3.13-1.6 GM/177ML PO SOLN: 1.0000 | ORAL | 0 refills | Status: DC

## 2018-06-28 NOTE — Progress Notes (Signed)
Referring Provider: Casilda Carls, MD Primary Care Physician:  Casilda Carls, MD   Reason for Consultation:  Rectal bleeding   IMPRESSION:  Rectal bleeding in the setting of nephrolithiasis Rectal bleeding resolved with passage of the stone Patient denies any hematuria  Differential for possible rectal bleeding given her normal rectal exam is broad. Colonoscopy recommended.    PLAN: CBC Colonoscopy  I consented the patient at the bedside today discussing the risks, benefits, and alternatives to endoscopic evaluation. In particular, we discussed the risks that include, but are not limited to, reaction to medication, cardiopulmonary compromise, bleeding requiring blood transfusion, aspiration resulting in pneumonia, perforation requiring surgery, lack of diagnosis, severe illness requiring hospitalization, and even death. We reviewed the risk of missed lesion including polyps or even cancer. The patient acknowledges these risks and asks that we proceed.   HPI: [name] is a 43 y.o. female seen in consultation for rectal bleeding at the request of the [facility]. The history is obtained through the patient and review of her electronic health record. History of laparoscopic gastric severe (Dr. Boykin Reaper 2015), cholecystectomy, hysterectomy, hypertension, hyperlipidemia, hyoothyroidism, obesity.    Loose stools following gastric sleeve at [facility] in 2015. Intermittent bleeding with some loose stools has occurred intermittently since that time. Bleeding worsened recently. She was seen in the ED 06/16/18 and diagnosed with kidney stones after presenting with rectal bleeding. ED notes show LLQ abdominal pain that radiates to the left flank, mid abdomen, and suprapubic area.  She denies hematuria. Felt that by the end of the day she was only passing frank blood without stool.  No history of rectal pain or tenesmus. No fever, chills, or night sweats.  No external rectal  lesions. Intermittent constipation with straining occurring once every other month.   No blood seen since the kidney stone.   No prior colonoscopy, although this is documented in the ED note. Apparently she had an upper endoscopy prior to her bariatric surgery. . No known family history of colon cancer or polyps.   CT abd/pelvis with contrast performed in the ED 06/16/18 showed a dilated left ureter with associated stranding with a 0.4 cm stone in the bladder suggest recent stone passage.   Labs in the ED 06/16/18: WBC 15.7, hgb 12.4, platelets 276. Liver enzymes and lipase were normal.   Past Medical History:  Diagnosis Date  . Hyperglycemia   . Hyperlipidemia   . Hypertension   . Hypothyroidism   . Kidney stone   . Obesity   . Thyroid mass     Past Surgical History:  Procedure Laterality Date  . CESAREAN SECTION     x 2  . CHOLECYSTECTOMY N/A 12/14/2014   Procedure: LAPAROSCOPIC CHOLECYSTECTOMY;  Surgeon: Donnie Mesa, MD;  Location: [place];  Service: General;  Laterality: N/A;  . ERCP N/A 12/13/2014   Procedure: ENDOSCOPIC RETROGRADE CHOLANGIOPANCREATOGRAPHY (ERCP);  Surgeon: Carol Ada, MD;  Location: [facility] ENDOSCOPY;  Service: Endoscopy;  Laterality: N/A;  . PARTIAL HYSTERECTOMY    . SLEEVE GASTROPLASTY      Current Outpatient Medications  Medication Sig Dispense Refill  . Multiple Vitamin (MULTIVITAMIN) tablet Take 1 tablet by mouth daily.    . Na Sulfate-K Sulfate-Mg Sulf 17.5-3.13-1.6 GM/177ML SOLN Take 1 kit by mouth as directed. 1 Bottle 0   No current facility-administered medications for this visit.     Allergies as of 06/28/2018  . (No Known Allergies)    Family History  Problem Relation Age of  Onset  . Hypertension Mother   . Hyperlipidemia Mother   . Hypertension Father   . Hyperlipidemia Father   . Heart attack Paternal Aunt 52  . Heart attack Paternal Aunt 13  . Heart attack Paternal Uncle     Social History   Socioeconomic History  . Marital  status: Married    Spouse name: Not on file  . Number of children: 2  . Years of education: Not on file  . Highest education level: Not on file  Occupational History  . Occupation: [employer]  . Financial resource strain: Not on file  . Food insecurity:    Worry: Not on file    Inability: Not on file  . Transportation needs:    Medical: Not on file    Non-medical: Not on file  Tobacco Use  . Smoking status: Never Smoker  . Smokeless tobacco: Never Used  Substance and Sexual Activity  . Alcohol use: Yes    Comment: occas wine.  . Drug use: No  . Sexual activity: Not on file  Lifestyle  . Physical activity:    Days per week: Not on file    Minutes per session: Not on file  . Stress: Not on file  Relationships  . Social connections:    Talks on phone: Not on file    Gets together: Not on file    Attends religious service: Not on file    Active member of club or organization: Not on file    Attends meetings of clubs or organizations: Not on file    Relationship status: Not on file  . Intimate partner violence:    Fear of current or ex partner: Not on file    Emotionally abused: Not on file    Physically abused: Not on file    Forced sexual activity: Not on file  Other Topics Concern  . Not on file  Social History Narrative  . Not on file    Review of Systems: 12 system ROS is negative except as noted above.  Filed Weights   06/28/18 1422  Weight: 167 lb (75.8 kg)    Physical Exam: Vital signs were reviewed. General:   Alert, well-nourished, pleasant and cooperative in NAD Head:  Normocephalic and atraumatic. Eyes:  Sclera clear, no icterus.   Conjunctiva pink. Mouth:  No deformity or lesions.   Neck:  Supple; no thyromegaly. Lungs:  Clear throughout to auscultation.   No wheezes. Heart:  Regular rate and rhythm; no murmurs Abdomen:  Soft, nontender, normal bowel sounds. No rebound or guarding. No hepatosplenomegaly Rectal:   No chemical  dermatitis. No external hemorrhoids, fissure or fistula. No prolapsing hemorrhoids. No rectal prolapse. Normal anocutaneous reflex. No stool in the rectal vault. No mass or fecal impaction. Normal anal resting tone. Chaperone: Desiree Msk:  Symmetrical without gross deformities. Extremities:  No gross deformities or edema. Neurologic:  Alert and  oriented x4;  grossly nonfocal Skin:  No rash or bruise. Psych:  Alert and cooperative. Normal mood and affect.   [place] Delauder L. Tarri Glenn, MD, MPH [facility] 07/04/2018, 2:18 PM

## 2018-06-28 NOTE — Patient Instructions (Signed)
Your provider has requested that you go to the basement level for lab work before leaving today. Press "B" on the elevator. The lab is located at the first door on the left as you exit the elevator.  You have been scheduled for a colonoscopy. Please follow written instructions given to you at your visit today.  Please pick up your prep supplies at the pharmacy within the next 1-3 days. If you use inhalers (even only as needed), please bring them with you on the day of your procedure. Your physician has requested that you go to www.startemmi.com and enter the access code given to you at your visit today. This web site gives a general overview about your procedure. However, you should still follow specific instructions given to you by our office regarding your preparation for the procedure.  

## 2018-07-03 ENCOUNTER — Encounter: Payer: Self-pay | Admitting: Gastroenterology

## 2018-07-04 ENCOUNTER — Encounter: Payer: Self-pay | Admitting: Gastroenterology

## 2018-07-15 ENCOUNTER — Ambulatory Visit (AMBULATORY_SURGERY_CENTER): Payer: 59 | Admitting: Gastroenterology

## 2018-07-15 ENCOUNTER — Encounter: Payer: Self-pay | Admitting: Gastroenterology

## 2018-07-15 VITALS — BP 112/82 | HR 63 | Temp 98.0°F | Resp 12 | Ht 64.0 in | Wt 167.0 lb

## 2018-07-15 DIAGNOSIS — K625 Hemorrhage of anus and rectum: Secondary | ICD-10-CM

## 2018-07-15 DIAGNOSIS — D125 Benign neoplasm of sigmoid colon: Secondary | ICD-10-CM | POA: Diagnosis not present

## 2018-07-15 DIAGNOSIS — D124 Benign neoplasm of descending colon: Secondary | ICD-10-CM

## 2018-07-15 MED ORDER — SODIUM CHLORIDE 0.9 % IV SOLN: 500.0000 mL | Freq: Once | INTRAVENOUS | Status: DC

## 2018-07-15 NOTE — Progress Notes (Signed)
Report to PACU, RN, vss, BBS= Clear.  

## 2018-07-15 NOTE — Patient Instructions (Signed)
Discharge instructions given. Handout on polyps. Resume previous medications. YOU HAD AN ENDOSCOPIC PROCEDURE TODAY AT THE [facility]:   Refer to the procedure report that was given to you for any specific questions about what was found during the examination.  If the procedure report does not answer your questions, please call your gastroenterologist to clarify.  If you requested that your care partner not be given the details of your procedure findings, then the procedure report has been included in a sealed envelope for you to review at your convenience later.  YOU SHOULD EXPECT: Some feelings of bloating in the abdomen. Passage of more gas than usual.  Walking can help get rid of the air that was put into your GI tract during the procedure and reduce the bloating. If you had a lower endoscopy (such as a colonoscopy or flexible sigmoidoscopy) you may notice spotting of blood in your stool or on the toilet paper. If you underwent a bowel prep for your procedure, you may not have a normal bowel movement for a few days.  Please Note:  You might notice some irritation and congestion in your nose or some drainage.  This is from the oxygen used during your procedure.  There is no need for concern and it should clear up in a day or so.  SYMPTOMS TO REPORT IMMEDIATELY:   Following lower endoscopy (colonoscopy or flexible sigmoidoscopy):  Excessive amounts of blood in the stool  Significant tenderness or worsening of abdominal pains  Swelling of the abdomen that is new, acute  Fever of 100F or higher   For urgent or emergent issues, a gastroenterologist can be reached at any hour by calling [phone].   DIET:  We do recommend a small meal at first, but then you may proceed to your regular diet.  Drink plenty of fluids but you should avoid alcoholic beverages for 24 hours.  ACTIVITY:  You should plan to take it easy for the rest of today and you should NOT DRIVE or use heavy  machinery until tomorrow (because of the sedation medicines used during the test).    FOLLOW UP: Our staff will call the number listed on your records the next business day following your procedure to check on you and address any questions or concerns that you may have regarding the information given to you following your procedure. If we do not reach you, we will leave a message.  However, if you are feeling well and you are not experiencing any problems, there is no need to return our call.  We will assume that you have returned to your regular daily activities without incident.  If any biopsies were taken you will be contacted by phone or by letter within the next 1-3 weeks.  Please call us at [phone] if you have not heard about the biopsies in 3 weeks.    SIGNATURES/CONFIDENTIALITY: You and/or your care partner have signed paperwork which will be entered into your electronic medical record.  These signatures attest to the fact that that the information above on your After Visit Summary has been reviewed and is understood.  Full responsibility of the confidentiality of this discharge information lies with you and/or your care-partner. 

## 2018-07-15 NOTE — Op Note (Signed)
[facility] Patient Name: [place] [name] Procedure Date: 07/15/2018 11:06 AM MRN: [id number] Endoscopist: Thornton Park MD, MD Age: 43 Referring MD:  Date of Birth: December 20, 1975 Gender: Female Account #: [id number] Procedure:                Colonoscopy Indications:              This is the patient's first colonoscopy, Rectal                            bleeding. No known family history of colon cancer                            or polyps. Medicines:                See the Anesthesia note for documentation of the                            administered medications Procedure:                Pre-Anesthesia Assessment:                           - Prior to the procedure, a History and Physical                            was performed, and patient medications and                            allergies were reviewed. The patient's tolerance of                            previous anesthesia was also reviewed. The risks                            and benefits of the procedure and the sedation                            options and risks were discussed with the patient.                            All questions were answered, and informed consent                            was obtained. Prior Anticoagulants: The patient has                            taken no previous anticoagulant or antiplatelet                            agents. ASA Grade Assessment: II - A patient with                            mild systemic disease. After reviewing the risks  and benefits, the patient was deemed in                            satisfactory condition to undergo the procedure.                           After obtaining informed consent, the colonoscope                            was passed under direct vision. Throughout the                            procedure, the patient's blood pressure, pulse, and                            oxygen saturations were monitored continuously.  The                            Colonoscope was introduced through the anus and                            advanced to the the terminal ileum, with                            identification of the appendiceal orifice and IC                            valve. The colonoscopy was performed without                            difficulty. The patient tolerated the procedure                            well. The quality of the bowel preparation was                            excellent. The terminal ileum, ileocecal valve,                            appendiceal orifice, and rectum were photographed. Scope In: 11:17:40 AM Scope Out: 11:32:07 AM Scope Withdrawal Time: 0 hours 10 minutes 23 seconds  Total Procedure Duration: 0 hours 14 minutes 27 seconds  Findings:                 The perianal and digital rectal examinations were                            normal.                           A 8 mm polyp was found in the distal descending                            colon. The polyp was flat. The polyp was removed  with a cold snare. Resection and retrieval were                            complete. Estimated blood loss was minimal.                           A 18 mm polyp was found in the distal sigmoid                            colon. It appeared bilobular. The polyp was                            pedunculated. The polyp was removed with a hot                            snare. Resection and retrieval were complete.                            Estimated blood loss: none.                           The exam was otherwise without abnormality on                            direct and retroflexion views. Complications:            No immediate complications. Estimated blood loss:                            None. Estimated Blood Loss:     Estimated blood loss was minimal. Impression:               - One 8 mm polyp in the distal descending colon,                            removed with  a cold snare. Resected and retrieved.                           - One 18 mm polyp in the distal sigmoid colon,                            removed with a hot snare. Resected and retrieved.                           - The examination was otherwise normal on direct                            and retroflexion views. Recommendation:           - Patient has a contact number available for                            emergencies. The signs and symptoms of potential  delayed complications were discussed with the                            patient. Return to normal activities tomorrow.                            Written discharge instructions were provided to the                            patient.                           - Resume previous diet.                           - Continue present medications.                           - Await pathology results.                           - Repeat colonoscopy in 3 years for surveillance if                            the larger polyps is adenomatous. Will confirm                            surveillance recommendations after reviewing the                            pathology results. Thornton Park MD, MD 07/15/2018 11:39:40 AM This report has been signed electronically.

## 2018-07-15 NOTE — Progress Notes (Signed)
Called to room to assist during endoscopic procedure.  Patient ID and intended procedure confirmed with present staff. Received instructions for my participation in the procedure from the performing physician.  

## 2018-07-16 ENCOUNTER — Telehealth: Payer: Self-pay

## 2018-07-16 NOTE — Telephone Encounter (Signed)
  Follow up Call-  Call back number 07/15/2018  Post procedure Call Back phone  # [phone]  Permission to leave phone message Yes  Some recent data might be hidden     Patient questions:  Do you have a fever, pain , or abdominal swelling? No. Pain Score  0 *  Have you tolerated food without any problems? Yes.    Have you been able to return to your normal activities? Yes.    Do you have any questions about your discharge instructions: Diet   No. Medications  No. Follow up visit  No.  Do you have questions or concerns about your Care? No.  Actions: * If pain score is 4 or above: No action needed, pain <4.  No problems noted per pt. maw

## 2018-07-19 ENCOUNTER — Encounter: Payer: Self-pay | Admitting: Gastroenterology

## 2019-04-07 ENCOUNTER — Other Ambulatory Visit: Payer: Self-pay | Admitting: Internal Medicine

## 2019-04-07 DIAGNOSIS — Z1231 Encounter for screening mammogram for malignant neoplasm of breast: Secondary | ICD-10-CM

## 2019-04-09 ENCOUNTER — Ambulatory Visit
Admission: RE | Admit: 2019-04-09 | Discharge: 2019-04-09 | Disposition: A | Discharge status: Home / Self Care | Payer: 59 | Source: Ambulatory Visit | Attending: Internal Medicine | Admitting: Internal Medicine

## 2019-04-09 DIAGNOSIS — Z1231 Encounter for screening mammogram for malignant neoplasm of breast: Secondary | ICD-10-CM | POA: Insufficient documentation

## 2019-04-10 ENCOUNTER — Other Ambulatory Visit: Payer: Self-pay | Admitting: Internal Medicine

## 2019-04-10 DIAGNOSIS — R928 Other abnormal and inconclusive findings on diagnostic imaging of breast: Secondary | ICD-10-CM

## 2019-04-21 ENCOUNTER — Ambulatory Visit
Admission: RE | Admit: 2019-04-21 | Discharge: 2019-04-21 | Disposition: A | Discharge status: Home / Self Care | Payer: 59 | Source: Ambulatory Visit | Attending: Internal Medicine | Admitting: Internal Medicine

## 2019-04-21 DIAGNOSIS — R928 Other abnormal and inconclusive findings on diagnostic imaging of breast: Secondary | ICD-10-CM | POA: Insufficient documentation

## 2019-07-08 ENCOUNTER — Other Ambulatory Visit: Payer: Self-pay | Admitting: Internal Medicine

## 2019-07-08 DIAGNOSIS — N6320 Unspecified lump in the left breast, unspecified quadrant: Secondary | ICD-10-CM

## 2019-08-06 ENCOUNTER — Ambulatory Visit: Payer: 59 | Attending: Internal Medicine

## 2019-08-14 ENCOUNTER — Ambulatory Visit: Payer: 59 | Attending: Internal Medicine

## 2019-08-14 DIAGNOSIS — Z23 Encounter for immunization: Secondary | ICD-10-CM | POA: Insufficient documentation

## 2019-08-14 NOTE — Progress Notes (Signed)
   Covid-19 Vaccination Clinic  Name:  [name]    MRN: [id number] DOB: 03/26/1976  08/14/2019  [name] was observed post Covid-19 immunization for 15 minutes without incident. She was provided with Vaccine Information Sheet and instruction to access the V-Safe system.   [name] was instructed to call 911 with any severe reactions post vaccine: Marland Kitchen Difficulty breathing  . Swelling of face and throat  . A fast heartbeat  . A bad rash all over body  . Dizziness and weakness   Immunizations Administered    Name Date Dose VIS Date Route   Pfizer COVID-19 Vaccine 08/14/2019  5:24 PM 0.3 mL 05/23/2019 Intramuscular   Manufacturer: [place]   Lot: [id number]   [place]: [id number]

## 2019-09-10 ENCOUNTER — Ambulatory Visit: Payer: 59 | Attending: Internal Medicine

## 2019-09-10 DIAGNOSIS — Z23 Encounter for immunization: Secondary | ICD-10-CM

## 2019-09-10 NOTE — Progress Notes (Signed)
   Covid-19 Vaccination Clinic  Name:  [name]    MRN: [id number] DOB: Oct 13, 1975  09/10/2019  [name] was observed post Covid-19 immunization for 15 minutes without incident. She was provided with Vaccine Information Sheet and instruction to access the V-Safe system.   [name] was instructed to call 911 with any severe reactions post vaccine: Marland Kitchen Difficulty breathing  . Swelling of face and throat  . A fast heartbeat  . A bad rash all over body  . Dizziness and weakness   Immunizations Administered    Name Date Dose VIS Date Route   Pfizer COVID-19 Vaccine 09/10/2019  8:56 AM 0.3 mL 05/23/2019 Intramuscular   Manufacturer: [place]   Lot: [id number]   [place]: [id number]

## 2020-02-19 ENCOUNTER — Encounter: Payer: Self-pay | Admitting: Family Medicine

## 2020-02-19 ENCOUNTER — Other Ambulatory Visit: Payer: Self-pay

## 2020-02-19 ENCOUNTER — Ambulatory Visit (INDEPENDENT_AMBULATORY_CARE_PROVIDER_SITE_OTHER): Payer: 59 | Admitting: Family Medicine

## 2020-02-19 ENCOUNTER — Ambulatory Visit: Payer: 59 | Admitting: Family Medicine

## 2020-02-19 ENCOUNTER — Other Ambulatory Visit (HOSPITAL_COMMUNITY)
Admission: RE | Admit: 2020-02-19 | Discharge: 2020-02-19 | Disposition: A | Discharge status: Home / Self Care | Payer: 59 | Source: Ambulatory Visit | Attending: Family Medicine | Admitting: Family Medicine

## 2020-02-19 VITALS — BP 136/96 | HR 63 | Ht 65.0 in | Wt 180.6 lb

## 2020-02-19 DIAGNOSIS — B373 Candidiasis of vulva and vagina: Secondary | ICD-10-CM | POA: Insufficient documentation

## 2020-02-19 DIAGNOSIS — Z01419 Encounter for gynecological examination (general) (routine) without abnormal findings: Secondary | ICD-10-CM | POA: Diagnosis not present

## 2020-02-19 DIAGNOSIS — B3731 Acute candidiasis of vulva and vagina: Secondary | ICD-10-CM

## 2020-02-19 MED ORDER — FLUCONAZOLE 150 MG PO TABS: 150.0000 mg | ORAL_TABLET | Freq: Every day | ORAL | 2 refills | Status: DC

## 2020-02-19 NOTE — Progress Notes (Signed)
  Subjective:     [name] is a 44 y.o. female and is here for a comprehensive physical exam. The patient reports problems - vaginal discharge. Having discharge and odor. Had recent trich and thinks it has not gone away. Has pain. Has had hysterectomy and has occasional pain. Still has ovaries. 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 Getting married in 16 days.    The following portions of the patient's history were reviewed and updated as appropriate: allergies, current medications, past family history, past medical history, past social history, past surgical history and problem list.  Review of Systems Pertinent items noted in HPI and remainder of comprehensive ROS otherwise negative.   Objective:    BP (!) 136/96   Pulse 63   Ht 5\' 5"  (1.651 m)   Wt 180 lb 9.6 oz (81.9 kg)   BMI 30.05 kg/m  General appearance: alert, cooperative and appears stated age Head: Normocephalic, without obvious abnormality, atraumatic Neck: no adenopathy, supple, symmetrical, trachea midline and thyroid not enlarged, symmetric, no tenderness/mass/nodules Lungs: clear to auscultation bilaterally Breasts: well healed scars, bilateral implants, no masses Heart: regular rate and rhythm, S1, S2 normal, no murmur, click, rub or gallop Abdomen: soft, non-tender; bowel sounds normal; no masses,  no organomegaly Pelvic: external genitalia normal, no cervical motion tenderness, uterus surgically absent and clumpy white discharge Extremities: extremities normal, atraumatic, no cyanosis or edema Pulses: 2+ and symmetric Skin: small inclusion cyst under left axilla Lymph nodes: Cervical, supraclavicular, and axillary nodes normal. Neurologic: Grossly normal    Assessment:    Healthy female exam.      Plan:  Yeast vaginitis - diflucan, check all cx's. Boric acid suppositories 2x/wk - Plan: Cervicovaginal ancillary only(  [place]), fluconazole (DIFLUCAN) 150 MG tablet  Encounter for gynecological examination without abnormal finding - has PCP who does labs. mammogram 05/2019  Return in 1 year (on 02/18/2021).    See After Visit Summary for Counseling Recommendations

## 2020-02-19 NOTE — Patient Instructions (Signed)
 Preventive Care 21-44 Years Old, Female Preventive care refers to visits with your health care provider and lifestyle choices that can promote health and wellness. This includes:  A yearly physical exam. This may also be called an annual well check.  Regular dental visits and eye exams.  Immunizations.  Screening for certain conditions.  Healthy lifestyle choices, such as eating a healthy diet, getting regular exercise, not using drugs or products that contain nicotine and tobacco, and limiting alcohol use. What can I expect for my preventive care visit? Physical exam Your health care provider will check your:  Height and weight. This may be used to calculate body mass index (BMI), which tells if you are at a healthy weight.  Heart rate and blood pressure.  Skin for abnormal spots. Counseling Your health care provider may ask you questions about your:  Alcohol, tobacco, and drug use.  Emotional well-being.  Home and relationship well-being.  Sexual activity.  Eating habits.  Work and work environment.  Method of birth control.  Menstrual cycle.  Pregnancy history. What immunizations do I need?  Influenza (flu) vaccine  This is recommended every year. Tetanus, diphtheria, and pertussis (Tdap) vaccine  You may need a Td booster every 10 years. Varicella (chickenpox) vaccine  You may need this if you have not been vaccinated. Human papillomavirus (HPV) vaccine  If recommended by your health care provider, you may need three doses over 6 months. Measles, mumps, and rubella (MMR) vaccine  You may need at least one dose of MMR. You may also need a second dose. Meningococcal conjugate (MenACWY) vaccine  One dose is recommended if you are age 19-21 years and a first-year college student living in a residence hall, or if you have one of several medical conditions. You may also need additional booster doses. Pneumococcal conjugate (PCV13) vaccine  You may need  this if you have certain conditions and were not previously vaccinated. Pneumococcal polysaccharide (PPSV23) vaccine  You may need one or two doses if you smoke cigarettes or if you have certain conditions. Hepatitis A vaccine  You may need this if you have certain conditions or if you travel or work in places where you may be exposed to hepatitis A. Hepatitis B vaccine  You may need this if you have certain conditions or if you travel or work in places where you may be exposed to hepatitis B. Haemophilus influenzae type b (Hib) vaccine  You may need this if you have certain conditions. You may receive vaccines as individual doses or as more than one vaccine together in one shot (combination vaccines). Talk with your health care provider about the risks and benefits of combination vaccines. What tests do I need?  Blood tests  Lipid and cholesterol levels. These may be checked every 5 years starting at age 20.  Hepatitis C test.  Hepatitis B test. Screening  Diabetes screening. This is done by checking your blood sugar (glucose) after you have not eaten for a while (fasting).  Sexually transmitted disease (STD) testing.  BRCA-related cancer screening. This may be done if you have a family history of breast, ovarian, tubal, or peritoneal cancers.  Pelvic exam and Pap test. This may be done every 3 years starting at age 21. Starting at age 30, this may be done every 5 years if you have a Pap test in combination with an HPV test. Talk with your health care provider about your test results, treatment options, and if necessary, the need for more   tests. Follow these instructions at home: Eating and drinking   Eat a diet that includes fresh fruits and vegetables, whole grains, lean protein, and low-fat dairy.  Take vitamin and mineral supplements as recommended by your health care provider.  Do not drink alcohol if: ? Your health care provider tells you not to drink. ? You are  pregnant, may be pregnant, or are planning to become pregnant.  If you drink alcohol: ? Limit how much you have to 0-1 drink a day. ? Be aware of how much alcohol is in your drink. In the [place] one drink equals one 12 oz bottle of beer (355 mL), one 5 oz glass of wine (148 mL), or one 1 oz glass of hard liquor (44 mL). Lifestyle  Take daily care of your teeth and gums.  Stay active. Exercise for at least 30 minutes on 5 or more days each week.  Do not use any products that contain nicotine or tobacco, such as cigarettes, e-cigarettes, and chewing tobacco. If you need help quitting, ask your health care provider.  If you are sexually active, practice safe sex. Use a condom or other form of birth control (contraception) in order to prevent pregnancy and STIs (sexually transmitted infections). If you plan to become pregnant, see your health care provider for a preconception visit. What's next?  Visit your health care provider once a year for a well check visit.  Ask your health care provider how often you should have your eyes and teeth checked.  Stay up to date on all vaccines. This information is not intended to replace advice given to you by your health care provider. Make sure you discuss any questions you have with your health care provider. Document Revised: 02/07/2018 Document Reviewed: 02/07/2018 Elsevier Patient Education  2020 [employer]  

## 2020-02-20 LAB — CERVICOVAGINAL ANCILLARY ONLY
Bacterial Vaginitis (gardnerella): POSITIVE — AB
Candida Glabrata: NEGATIVE
Candida Vaginitis: POSITIVE — AB
Chlamydia: NEGATIVE
Comment: NEGATIVE
Comment: NEGATIVE
Comment: NEGATIVE
Comment: NEGATIVE
Comment: NEGATIVE
Comment: NORMAL
Neisseria Gonorrhea: NEGATIVE
Trichomonas: NEGATIVE

## 2020-04-19 ENCOUNTER — Telehealth: Payer: Self-pay | Admitting: Radiology

## 2020-04-19 NOTE — Telephone Encounter (Signed)
Returned call to patient about scheduling appointment, unable to leave message, voicemail not set up.

## 2020-04-20 ENCOUNTER — Other Ambulatory Visit: Payer: Self-pay | Admitting: Family Medicine

## 2020-04-20 DIAGNOSIS — Z01818 Encounter for other preprocedural examination: Secondary | ICD-10-CM

## 2020-04-20 DIAGNOSIS — Z01419 Encounter for gynecological examination (general) (routine) without abnormal findings: Secondary | ICD-10-CM

## 2020-04-20 NOTE — Progress Notes (Signed)
Needs labs for pre-op clearance. Having plastic surgery in [place].

## 2020-04-22 ENCOUNTER — Other Ambulatory Visit: Payer: 59

## 2020-04-22 ENCOUNTER — Other Ambulatory Visit: Payer: Self-pay

## 2020-04-22 DIAGNOSIS — Z01818 Encounter for other preprocedural examination: Secondary | ICD-10-CM

## 2020-04-28 ENCOUNTER — Other Ambulatory Visit: Payer: Self-pay | Admitting: Family Medicine

## 2020-04-28 ENCOUNTER — Other Ambulatory Visit: Payer: 59

## 2020-04-28 ENCOUNTER — Other Ambulatory Visit: Payer: Self-pay

## 2020-04-28 DIAGNOSIS — N6320 Unspecified lump in the left breast, unspecified quadrant: Secondary | ICD-10-CM

## 2020-04-28 DIAGNOSIS — R2232 Localized swelling, mass and lump, left upper limb: Secondary | ICD-10-CM

## 2020-04-28 DIAGNOSIS — Z01818 Encounter for other preprocedural examination: Secondary | ICD-10-CM

## 2020-04-28 DIAGNOSIS — R928 Other abnormal and inconclusive findings on diagnostic imaging of breast: Secondary | ICD-10-CM

## 2020-04-28 DIAGNOSIS — Z1231 Encounter for screening mammogram for malignant neoplasm of breast: Secondary | ICD-10-CM

## 2020-04-29 ENCOUNTER — Other Ambulatory Visit: Payer: 59

## 2020-04-29 LAB — HIV ANTIBODY (ROUTINE TESTING W REFLEX): HIV Screen 4th Generation wRfx: NONREACTIVE

## 2020-04-29 LAB — COMPREHENSIVE METABOLIC PANEL
ALT: 13 IU/L (ref 0–32)
AST: 17 IU/L (ref 0–40)
Albumin/Globulin Ratio: 1.8 (ref 1.2–2.2)
Albumin: 4 g/dL (ref 3.8–4.8)
Alkaline Phosphatase: 51 IU/L (ref 44–121)
BUN/Creatinine Ratio: 8 — ABNORMAL LOW (ref 9–23)
BUN: 8 mg/dL (ref 6–24)
Bilirubin Total: 0.5 mg/dL (ref 0.0–1.2)
CO2: 22 mmol/L (ref 20–29)
Calcium: 9 mg/dL (ref 8.7–10.2)
Chloride: 103 mmol/L (ref 96–106)
Creatinine, Ser: 0.97 mg/dL (ref 0.57–1.00)
GFR calc Af Amer: 82 mL/min/{1.73_m2} (ref >59)
GFR calc non Af Amer: 71 mL/min/{1.73_m2} (ref >59)
Globulin, Total: 2.2 g/dL (ref 1.5–4.5)
Glucose: 82 mg/dL (ref 65–99)
Potassium: 4.5 mmol/L (ref 3.5–5.2)
Sodium: 140 mmol/L (ref 134–144)
Total Protein: 6.2 g/dL (ref 6.0–8.5)

## 2020-04-29 LAB — CBC
Hematocrit: 38.9 % (ref 34.0–46.6)
Hemoglobin: 12.3 g/dL (ref 11.1–15.9)
MCH: 26.3 pg — ABNORMAL LOW (ref 26.6–33.0)
MCHC: 31.6 g/dL (ref 31.5–35.7)
MCV: 83 fL (ref 79–97)
Platelets: 242 10*3/uL (ref 150–450)
RBC: 4.68 x10E6/uL (ref 3.77–5.28)
RDW: 13.1 % (ref 11.7–15.4)
WBC: 5.9 10*3/uL (ref 3.4–10.8)

## 2020-04-29 LAB — BETA HCG QUANT (REF LAB): hCG Quant: <1 m[IU]/mL

## 2020-04-29 LAB — PROTIME-INR
INR: 1 (ref 0.9–1.2)
Prothrombin Time: 10.3 s (ref 9.1–12.0)

## 2020-04-29 LAB — APTT: aPTT: 29 s (ref 24–33)

## 2020-05-11 ENCOUNTER — Ambulatory Visit
Admission: RE | Admit: 2020-05-11 | Discharge: 2020-05-11 | Disposition: A | Discharge status: Home / Self Care | Payer: 59 | Source: Ambulatory Visit | Attending: Family Medicine | Admitting: Family Medicine

## 2020-05-11 DIAGNOSIS — Z01419 Encounter for gynecological examination (general) (routine) without abnormal findings: Secondary | ICD-10-CM | POA: Diagnosis not present

## 2020-05-20 ENCOUNTER — Ambulatory Visit: Payer: 59

## 2020-05-20 ENCOUNTER — Ambulatory Visit: Admission: RE | Admit: 2020-05-20 | Payer: 59 | Source: Ambulatory Visit

## 2020-05-20 ENCOUNTER — Other Ambulatory Visit: Payer: Self-pay

## 2020-05-27 HISTORY — PX: BREAST ENHANCEMENT SURGERY: SHX7

## 2021-01-12 ENCOUNTER — Encounter: Payer: Self-pay | Admitting: Radiology

## 2021-02-17 ENCOUNTER — Other Ambulatory Visit: Payer: Self-pay

## 2021-02-17 ENCOUNTER — Emergency Department
Admission: EM | Admit: 2021-02-17 | Discharge: 2021-02-17 | Disposition: A | Discharge status: Home / Self Care | Payer: 59 | Attending: Emergency Medicine | Admitting: Emergency Medicine

## 2021-02-17 ENCOUNTER — Emergency Department: Payer: 59

## 2021-02-17 DIAGNOSIS — R197 Diarrhea, unspecified: Secondary | ICD-10-CM | POA: Diagnosis present

## 2021-02-17 DIAGNOSIS — R1084 Generalized abdominal pain: Secondary | ICD-10-CM | POA: Diagnosis not present

## 2021-02-17 LAB — COMPREHENSIVE METABOLIC PANEL
ALT: 15 U/L (ref 0–44)
AST: 20 U/L (ref 15–41)
Albumin: 3.7 g/dL (ref 3.5–5.0)
Alkaline Phosphatase: 46 U/L (ref 38–126)
Anion gap: 8 (ref 5–15)
BUN: 8 mg/dL (ref 6–20)
CO2: 25 mmol/L (ref 22–32)
Calcium: 8.4 mg/dL — ABNORMAL LOW (ref 8.9–10.3)
Chloride: 103 mmol/L (ref 98–111)
Creatinine, Ser: 0.97 mg/dL (ref 0.44–1.00)
GFR, Estimated: >60 mL/min (ref >60)
Glucose, Bld: 97 mg/dL (ref 70–99)
Potassium: 3.4 mmol/L — ABNORMAL LOW (ref 3.5–5.1)
Sodium: 136 mmol/L (ref 135–145)
Total Bilirubin: 0.8 mg/dL (ref 0.3–1.2)
Total Protein: 6.7 g/dL (ref 6.5–8.1)

## 2021-02-17 LAB — CBC
HCT: 37.4 % (ref 36.0–46.0)
Hemoglobin: 12.4 g/dL (ref 12.0–15.0)
MCH: 26.5 pg (ref 26.0–34.0)
MCHC: 33.2 g/dL (ref 30.0–36.0)
MCV: 79.9 fL — ABNORMAL LOW (ref 80.0–100.0)
Platelets: 230 10*3/uL (ref 150–400)
RBC: 4.68 MIL/uL (ref 3.87–5.11)
RDW: 14.9 % (ref 11.5–15.5)
WBC: 7.9 10*3/uL (ref 4.0–10.5)
nRBC: 0 % (ref 0.0–0.2)

## 2021-02-17 LAB — LIPASE, BLOOD: Lipase: 28 U/L (ref 11–51)

## 2021-02-17 MED ORDER — IOHEXOL 350 MG/ML SOLN
80.0000 mL | Freq: Once | INTRAVENOUS | Status: AC | PRN
  Administered 2021-02-17: 80 mL via INTRAVENOUS

## 2021-02-17 MED ORDER — LOPERAMIDE HCL 2 MG PO TABS: 4.0000 mg | ORAL_TABLET | Freq: Four times a day (QID) | ORAL | 0 refills | Status: DC | PRN

## 2021-02-17 NOTE — Discharge Instructions (Signed)
Your CT scan today shows inflammation of the small intestine which is most likely due to a viral illness.  Your lab tests are normal.  Take Imodium as needed to control diarrhea, and be sure to drink lots of fluids to stay hydrated.  This can be expected to resolve on its own within the next few days.  Including yogurt and probiotics in your diet can be helpful.

## 2021-02-17 NOTE — ED Provider Notes (Signed)
[facility] Emergency Department Provider Note  ____________________________________________  Time seen: Approximately 2:27 PM  I have reviewed the triage vital signs and the nursing notes.   HISTORY  Chief Complaint Diarrhea    HPI [name] is a 45 y.o. female with a history of hyperlipidemia, kidney stones, obesity, gastric sleeve bariatric surgery who comes ED complaining of diarrhea for the past 24 hours, watery and plan.  This is in the context of starting metoprolol and sertraline recently.  She has taken 1 dose of the sertraline so far.  Has some generalized abdominal pain which is waxing waning, no aggravating or alleviating factors.  Still able to take p.o., no vomiting.  No black or bloody stool.    Past Medical History:  Diagnosis Date   Hyperglycemia    Hyperlipidemia    Kidney stone    Obesity    Thyroid mass      Patient Active Problem List   Diagnosis Date Noted   Choledocholithiasis 12/12/2014   UTI (urinary tract infection) 12/12/2014     Past Surgical History:  Procedure Laterality Date   CESAREAN SECTION     x 2   CHOLECYSTECTOMY N/A 12/14/2014   Procedure: LAPAROSCOPIC CHOLECYSTECTOMY;  Surgeon: Donnie Mesa, MD;  Location: [facility];  Service: General;  Laterality: N/A;   ERCP N/A 12/13/2014   Procedure: ENDOSCOPIC RETROGRADE CHOLANGIOPANCREATOGRAPHY (ERCP);  Surgeon: Carol Ada, MD;  Location[facility]COPY;  Service: Endoscopy;  Laterality: N/A;   PARTIAL HYSTERECTOMY     SLEEVE GASTROPLASTY       Prior to Admission medications   Medication Sig Start Date End Date Taking? Authorizing Provider  loperamide (IMODIUM A-D) 2 MG tablet Take 2 tablets (4 mg total) by mouth 4 (four) times daily as needed for diarrhea or loose stools. 02/17/21  Yes Carrie Mew, MD  fluconazole (DIFLUCAN) 150 MG tablet Take 1 tablet (150 mg total) by mouth daily. Repeat in 24 hours if needed 02/19/20   Donnamae Jude, MD  Multiple  Vitamin (MULTIVITAMIN) tablet Take 1 tablet by mouth daily.    [provider]     Allergies Patient has no known allergies.   Family History  Problem Relation Age of Onset   Hypertension Mother    Hyperlipidemia Mother    Hypertension Father    Hyperlipidemia Father    Heart attack Paternal Aunt 42   Heart attack Paternal Aunt 36   Heart attack Paternal Uncle     Social History Social History   Tobacco Use   Smoking status: Never   Smokeless tobacco: Never  Vaping Use   Vaping Use: Never used  Substance Use Topics   Alcohol use: Yes    Comment: occas wine.   Drug use: No    Review of Systems  Constitutional:   No fever or chills.  ENT:   No sore throat. No rhinorrhea. Cardiovascular:   No chest pain or syncope. Respiratory:   No dyspnea or cough. Gastrointestinal:   Positive as above for abdominal pain and vomiting Musculoskeletal:   Negative for focal pain or swelling All other systems reviewed and are negative except as documented above in ROS and HPI.  ____________________________________________   PHYSICAL EXAM:  VITAL SIGNS: ED Triage Vitals [02/17/21 1112]  Enc Vitals Group     BP (!) 147/107     Pulse Rate 81     Resp 18     Temp 98.3 F (36.8 C)     Temp Source Oral  SpO2 100 %     Weight 170 lb (77.1 kg)     Height '5\' 5"'$  (1.651 m)     Head Circumference      Peak Flow      Pain Score 0     Pain Loc      Pain Edu?      Excl. in [place]?     Vital signs reviewed, nursing assessments reviewed.   Constitutional:   Alert and oriented. Non-toxic appearance. Eyes:   Conjunctivae are normal. EOMI. PERRL. ENT      Head:   Normocephalic and atraumatic.      Nose:   Wearing a mask.      Mouth/Throat:   Wearing a mask.      Neck:   No meningismus. Full ROM. Hematological/Lymphatic/Immunilogical:   No cervical lymphadenopathy. Cardiovascular:   RRR. Symmetric bilateral radial and DP pulses.  No murmurs. Cap refill less than 2  seconds. Respiratory:   Normal respiratory effort without tachypnea/retractions. Breath sounds are clear and equal bilaterally. No wheezes/rales/rhonchi. Gastrointestinal:   Soft with mild generalized tenderness. Non distended. There is no CVA tenderness.  No rebound, rigidity, or guarding. Genitourinary:   deferred Musculoskeletal:   Normal range of motion in all extremities. No joint effusions.  No lower extremity tenderness.  No edema. Neurologic:   Normal speech and language.  Motor grossly intact. No acute focal neurologic deficits are appreciated.  Skin:    Skin is warm, dry and intact. No rash noted.  No petechiae, purpura, or bullae.  ____________________________________________    LABS (pertinent positives/negatives) (all labs ordered are listed, but only abnormal results are displayed) Labs Reviewed  COMPREHENSIVE METABOLIC PANEL - Abnormal; Notable for the following components:      Result Value   Potassium 3.4 (*)    Calcium 8.4 (*)    All other components within normal limits  CBC - Abnormal; Notable for the following components:   MCV 79.9 (*)    All other components within normal limits  LIPASE, BLOOD  URINALYSIS, COMPLETE (UACMP) WITH MICROSCOPIC   ____________________________________________   EKG    ____________________________________________    RADIOLOGY  CT ABDOMEN PELVIS W CONTRAST  Result Date: 02/17/2021 CLINICAL DATA:  Abdominal pain, diarrhea EXAM: CT ABDOMEN AND PELVIS WITH CONTRAST TECHNIQUE: Multidetector CT imaging of the abdomen and pelvis was performed using the standard protocol following bolus administration of intravenous contrast. CONTRAST:  69m OMNIPAQUE IOHEXOL 350 MG/ML SOLN COMPARISON:  06/16/2018 FINDINGS: Lower chest: Included lung bases are clear. Heart size is normal. Partially visualized bilateral breast prostheses. Hepatobiliary: No focal liver abnormality is seen. Status post cholecystectomy. No biliary dilatation. Pancreas:  Unremarkable. No pancreatic ductal dilatation or surrounding inflammatory changes. Spleen: Normal in size without focal abnormality. Adrenals/Urinary Tract: Unremarkable adrenal glands. Kidneys enhance symmetrically without focal lesion, stone, or hydronephrosis. Ureters are nondilated. Urinary bladder appears unremarkable. Stomach/Bowel: Postsurgical changes of prior sleeve gastrectomy. There is prominent circumferential wall thickening of multiple ileal bowel loops within the abdomen which are fluid-filled (for example series 2, image 56). No abnormally dilated loops of bowel. Liquid stool throughout the colon with air-fluid levels. No focal colonic wall thickening or inflammatory changes. Normal appendix is seen in the right lower quadrant (series 2, image 58). Vascular/Lymphatic: No significant vascular findings are present. No enlarged abdominal or pelvic lymph nodes. Reproductive: Partial hysterectomy.  No adnexal masses. Other: No free fluid. No abdominopelvic fluid collection. No pneumoperitoneum. No abdominal wall hernia. Musculoskeletal: No acute or significant osseous findings.  IMPRESSION: Prominent circumferential wall thickening of multiple ileal bowel loops within the abdomen which are fluid-filled. Liquid stool throughout the colon with air-fluid levels. Findings are suggestive of an infectious or inflammatory enteritis and diarrheal state. No evidence of bowel obstruction. Electronically Signed   By: Davina Poke D.O.   On: 02/17/2021 13:51    ____________________________________________   PROCEDURES Procedures  ____________________________________________  DIFFERENTIAL DIAGNOSIS   Bowel obstruction, diverticulitis, gastritis, viral enteritis, appendicitis  CLINICAL IMPRESSION / ASSESSMENT AND PLAN / ED COURSE  Medications ordered in the ED: Medications  iohexol (OMNIPAQUE) 350 MG/ML injection 80 mL (80 mLs Intravenous Contrast Given 02/17/21 1312)    Pertinent labs & imaging  results that were available during my care of the patient were reviewed by me and considered in my medical decision making (see chart for details).  [name] was evaluated in Emergency Department on 02/17/2021 for the symptoms described in the history of present illness. She was evaluated in the context of the global COVID-19 pandemic, which necessitated consideration that the patient might be at risk for infection with the SARS-CoV-2 virus that causes COVID-19. Institutional protocols and algorithms that pertain to the evaluation of patients at risk for COVID-19 are in a state of rapid change based on information released by regulatory bodies including the CDC and federal and state organizations. These policies and algorithms were followed during the patient's care in the ED.   Patient presents with abdominal pain and diarrhea in the setting of prior bariatric surgery.  Vitals and labs are unremarkable, but with abdominal tenderness and complicated abdomen, CT obtained which demonstrates enteritis, presumed infectious.  She is nontoxic, she is tolerating p.o.  Imodium as needed, hydration, supportive care expecting this to resolve on its own.  She will follow-up if symptoms persist more than the next few days.      ____________________________________________   FINAL CLINICAL IMPRESSION(S) / ED DIAGNOSES    Final diagnoses:  Diarrhea of presumed infectious origin     ED Discharge Orders          Ordered    loperamide (IMODIUM A-D) 2 MG tablet  4 times daily PRN        02/17/21 1426            Portions of this note were generated with dragon dictation software. Dictation errors may occur despite best attempts at proofreading.    Carrie Mew, MD 02/17/21 [phone]

## 2021-02-17 NOTE — ED Triage Notes (Signed)
Pt to ED for diarrhea after started new medication metoprolol and sertraline. States she had diarrhea while sleeping last night.

## 2021-02-17 NOTE — ED Notes (Signed)
Pt taken for CT 

## 2021-03-17 ENCOUNTER — Ambulatory Visit: Payer: 59 | Admitting: Family Medicine

## 2021-04-20 ENCOUNTER — Ambulatory Visit: Payer: 59 | Admitting: Family Medicine

## 2021-08-05 IMAGING — CR DG CHEST 1V
1 series · 1 of 1 positions shown · non-contrast
Comparison: April 07, 2014

CLINICAL DATA: Preoperative assessment

EXAM:
CHEST  1 VIEW

[chest pa]
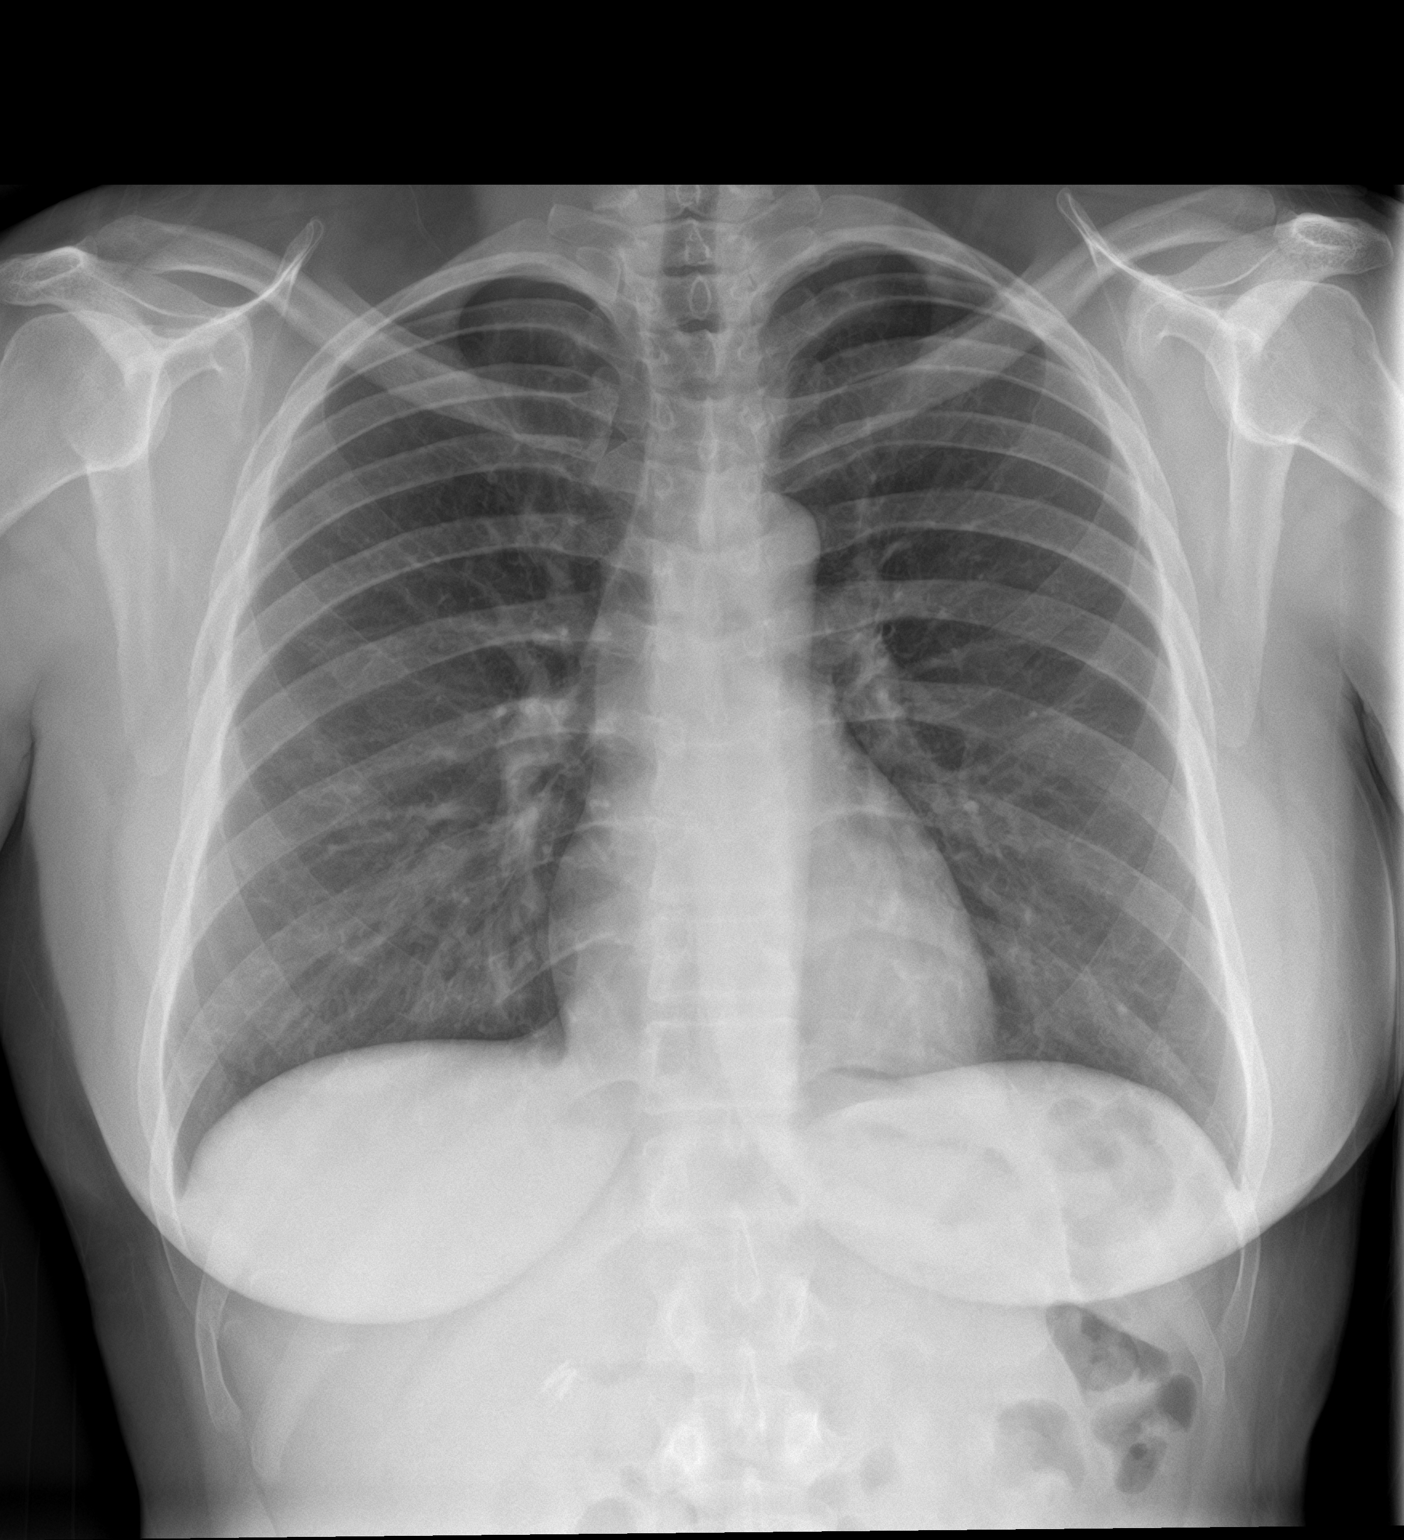

[1 of 1 positions shown; findings below may reference images not displayed]

FINDINGS: Lungs are clear. Heart size and pulmonary vascularity are normal. No
adenopathy. No bone lesions. There are surgical clips in the right
upper abdomen.
IMPRESSION: Lungs clear.  Cardiac silhouette normal.

## 2021-08-17 ENCOUNTER — Encounter: Payer: Self-pay | Admitting: Gastroenterology

## 2021-09-14 ENCOUNTER — Other Ambulatory Visit: Payer: Self-pay | Admitting: Internal Medicine

## 2021-09-14 DIAGNOSIS — M25562 Pain in left knee: Secondary | ICD-10-CM

## 2021-10-07 ENCOUNTER — Other Ambulatory Visit: Payer: Self-pay

## 2021-10-07 DIAGNOSIS — N898 Other specified noninflammatory disorders of vagina: Secondary | ICD-10-CM

## 2021-10-07 MED ORDER — FLUCONAZOLE 150 MG PO TABS
150.0000 mg | ORAL_TABLET | Freq: Once | ORAL | 3 refills | Status: AC
Start: 2021-10-07 — End: 2021-10-07

## 2021-10-07 MED ORDER — METRONIDAZOLE 500 MG PO TABS
500.0000 mg | ORAL_TABLET | Freq: Two times a day (BID) | ORAL | 0 refills | Status: DC
Start: 2021-10-07 — End: 2021-12-26

## 2021-10-07 NOTE — Progress Notes (Signed)
Pt requested Rx for BV and yeast  ?Rx's sent per protocol.  ? ? ?

## 2021-11-17 ENCOUNTER — Other Ambulatory Visit: Payer: Self-pay

## 2021-11-17 NOTE — Progress Notes (Signed)
Pt walked in office today requesting nurse look/evaluate breast issue. Pt stated she will be going out of town. Pt advised no appt ava at this direct time On a providers schedule and no provider will be in the office tomorrow I advised pt to see urgent care/PCP then make appt for f/u w/Dr.Pratt.  Pt voiced understanding.

## 2021-12-26 ENCOUNTER — Emergency Department: Payer: BC Managed Care – PPO

## 2021-12-26 ENCOUNTER — Emergency Department
Admission: EM | Admit: 2021-12-26 | Discharge: 2021-12-26 | Disposition: A | Discharge status: Home / Self Care | Payer: BC Managed Care – PPO | Attending: Emergency Medicine | Admitting: Emergency Medicine

## 2021-12-26 ENCOUNTER — Other Ambulatory Visit: Payer: Self-pay

## 2021-12-26 DIAGNOSIS — I10 Essential (primary) hypertension: Secondary | ICD-10-CM | POA: Diagnosis not present

## 2021-12-26 DIAGNOSIS — R42 Dizziness and giddiness: Secondary | ICD-10-CM | POA: Diagnosis present

## 2021-12-26 DIAGNOSIS — R29810 Facial weakness: Secondary | ICD-10-CM | POA: Insufficient documentation

## 2021-12-26 LAB — TROPONIN I (HIGH SENSITIVITY): Troponin I (High Sensitivity): <2 ng/L (ref <18)

## 2021-12-26 LAB — BASIC METABOLIC PANEL
Anion gap: 8 (ref 5–15)
BUN: 11 mg/dL (ref 6–20)
CO2: 24 mmol/L (ref 22–32)
Calcium: 8.9 mg/dL (ref 8.9–10.3)
Chloride: 108 mmol/L (ref 98–111)
Creatinine, Ser: 0.91 mg/dL (ref 0.44–1.00)
GFR, Estimated: >60 mL/min (ref >60)
Glucose, Bld: 78 mg/dL (ref 70–99)
Potassium: 4 mmol/L (ref 3.5–5.1)
Sodium: 140 mmol/L (ref 135–145)

## 2021-12-26 LAB — URINALYSIS, ROUTINE W REFLEX MICROSCOPIC
Bilirubin Urine: NEGATIVE
Glucose, UA: NEGATIVE mg/dL
Hgb urine dipstick: NEGATIVE
Ketones, ur: NEGATIVE mg/dL
Leukocytes,Ua: NEGATIVE
Nitrite: NEGATIVE
Protein, ur: NEGATIVE mg/dL
Specific Gravity, Urine: 1.027 (ref 1.005–1.030)
pH: 5 (ref 5.0–8.0)

## 2021-12-26 LAB — CBC
HCT: 39.2 % (ref 36.0–46.0)
Hemoglobin: 12 g/dL (ref 12.0–15.0)
MCH: 25.2 pg — ABNORMAL LOW (ref 26.0–34.0)
MCHC: 30.6 g/dL (ref 30.0–36.0)
MCV: 82.4 fL (ref 80.0–100.0)
Platelets: 233 10*3/uL (ref 150–400)
RBC: 4.76 MIL/uL (ref 3.87–5.11)
RDW: 14.1 % (ref 11.5–15.5)
WBC: 6.9 10*3/uL (ref 4.0–10.5)
nRBC: 0 % (ref 0.0–0.2)

## 2021-12-26 MED ORDER — GADOBUTROL 1 MMOL/ML IV SOLN
7.0000 mL | Freq: Once | INTRAVENOUS | Status: AC | PRN
  Administered 2021-12-26: 7 mL via INTRAVENOUS

## 2021-12-26 NOTE — ED Provider Notes (Signed)
Patient was signed out to me pending MRI of the brain.  In brief she is a 46 year old female presenting with intermittent dizziness and right-sided facial droop.  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  Neurology was contacted and recommends MRI brain with and without.  His MRI of the brain with and without is negative for acute findings.  There is note of empty sella, can be incidental or seen in the setting of IIH.  Patient has been having some mild headache but no visual change diplopia.  My suspicion for IIH is low at this time.  Will refer to neurology as an outpatient.   Rada Hay, MD 12/26/21 [phone]

## 2021-12-26 NOTE — ED Triage Notes (Signed)
Pt comes with c/o vertigo. Pt states she started having some episodes last week with dizziness and numbness. Pt states she was seen by pcp and prescribed meds for vertigo. Pt states she feels the symptoms are not getting better but worse.

## 2021-12-26 NOTE — ED Provider Triage Note (Signed)
Emergency Medicine Provider Triage Evaluation Note  [name] , a 46 y.o. female  was evaluated in triage.  Pt complains of dizziness, right sided facial numbness/tingling, right arm and leg weakness.  Review of Systems  Positive: See above Negative: Cp/sob  Physical Exam  BP (!) 130/95 (BP Location: Right Arm)   Pulse 71   Temp 98 F (36.7 C) (Oral)   Resp 18   Ht '5\' 5"'$  (1.651 m)   Wt 78 kg   SpO2 100%   BMI 28.62 kg/m  Gen:   Awake, no distress   Resp:  Normal effort  MSK:   Moves extremities without difficulty  Other:    Medical Decision Making  Medically screening exam initiated at 11:41 AM.  Appropriate orders placed.  Diana Jyllian Haynie was informed that the remainder of the evaluation will be completed by another provider, this initial triage assessment does not replace that evaluation, and the importance of remaining in the ED until their evaluation is complete.  CT of the head ordered   Versie Starks, PA-C 12/26/21 1143

## 2021-12-26 NOTE — ED Provider Notes (Signed)
[facility] Provider Note   Event Date/Time   First MD Initiated Contact with Patient 12/26/21 1421     (approximate) History  Dizziness  HPI [name] is a 46 y.o. female with a past medical history of hypertension who presents for lightheadedness, unsteadiness on her feet, and intermittent right-sided facial droop.  Patient states that the symptoms have been present over the last few months but have been waxing and waning in intensity.  Patient states that the intensity worsened approximately 2 weeks prior to arrival.  Patient does state that these symptoms of lightheadedness and instability on her feet occur every day at this point.  Patient also endorses intermittent right facial droop with the last occurrence being approximately 30 minutes before arrival.  Patient does not have any known history of CVA ROS: Patient currently denies any vision changes, tinnitus, difficulty speaking, sore throat, chest pain, shortness of breath, abdominal pain, nausea/vomiting/diarrhea, dysuria, or weakness/numbness/paresthesias in any extremity   Physical Exam  Triage Vital Signs: ED Triage Vitals  Enc Vitals Group     BP 12/26/21 1137 (!) 130/95     Pulse Rate 12/26/21 1137 71     Resp 12/26/21 1137 18     Temp 12/26/21 1137 98 F (36.7 C)     Temp Source 12/26/21 1137 Oral     SpO2 12/26/21 1137 100 %     Weight 12/26/21 1138 172 lb (78 kg)     Height 12/26/21 1138 '5\' 5"'$  (1.651 m)     Head Circumference --      Peak Flow --      Pain Score 12/26/21 1052 4     Pain Loc --      Pain Edu? --      Excl. in [place]? --    Most recent vital signs: Vitals:   12/26/21 1137 12/26/21 1538  BP: (!) 130/95 128/80  Pulse: 71 70  Resp: 18 18  Temp: 98 F (36.7 C) 98 F (36.7 C)  SpO2: 100% 100%   General: Awake, oriented x4. CV:  Good peripheral perfusion.  Resp:  Normal effort.  Abd:  No distention.  Other:  Middle-aged African-American female laying in bed in no  acute distress.  NIHSS 0 at this time ED Results / Procedures / Treatments  Labs (all labs ordered are listed, but only abnormal results are displayed) Labs Reviewed  CBC - Abnormal; Notable for the following components:      Result Value   MCH 25.2 (*)    All other components within normal limits  BASIC METABOLIC PANEL  URINALYSIS, ROUTINE W REFLEX MICROSCOPIC  CBG MONITORING, ED  POC URINE PREG, ED  TROPONIN I (HIGH SENSITIVITY)  TROPONIN I (HIGH SENSITIVITY)   EKG ED ECG REPORT I, Naaman Plummer, the attending physician, personally viewed and interpreted this ECG. Date: 12/26/2021 EKG Time: 1145 Rate: 71 Rhythm: normal sinus rhythm QRS Axis: normal Intervals: normal ST/T Wave abnormalities: normal Narrative Interpretation: no evidence of acute ischemia RADIOLOGY ED MD interpretation: CT of the head without IV contrast interpreted by me and shows a small focus of low-attenuation within the right posterior parietal lobe likely reflecting a sequelae of remote subcortical infarct -Agree with radiology assessment Official radiology report(s): CT HEAD WO CONTRAST (5MM)  Result Date: 12/26/2021 CLINICAL DATA:  Dizziness. EXAM: CT HEAD WITHOUT CONTRAST TECHNIQUE: Contiguous axial images were obtained from the base of the skull through the vertex without intravenous contrast. RADIATION DOSE REDUCTION: This exam  was performed according to the departmental dose-optimization program which includes automated exposure control, adjustment of the mA and/or kV according to patient size and/or use of iterative reconstruction technique. COMPARISON:  None Available. FINDINGS: Brain: No evidence of acute infarction, hemorrhage, hydrocephalus, extra-axial collection or mass lesion/mass effect. Calcifications identified within the right basal ganglia, image 10/3. Although nonspecific, unilateral basal ganglia calcification may be seen with benign developmental venous anomaly. Small focus of low  attenuation within the right posterior parietal lobe is identified, image 16/3, which may reflect sequelae of remote subcortical infarct. Vascular: No hyperdense vessel or unexpected calcification. Skull: Normal. Negative for fracture or focal lesion. Sinuses/Orbits: No acute finding. Other: None IMPRESSION: 1. No acute intracranial abnormalities. 2. Small focus of low attenuation within the right posterior parietal lobe may reflect sequelae of remote subcortical infarct. Electronically Signed   By: Kerby Moors M.D.   On: 12/26/2021 12:35   PROCEDURES: Critical Care performed: No Procedures MEDICATIONS ORDERED IN ED: Medications - No data to display IMPRESSION / MDM / [place] / ED COURSE  I reviewed the triage vital signs and the nursing notes.                             The patient is on the cardiac monitor to evaluate for evidence of arrhythmia and/or significant heart rate changes. Patient's presentation is most consistent with acute presentation with potential threat to life or bodily function. Patient is a 46 year old female who presents for intermittent neurologic symptoms including unsteadiness on her feet, intermittent lightheadedness, and intermittent right facial droop.  Differential diagnosis includes but is not limited to: CVA, TIA, MS, meningitis, encephalitis, CHF, ACS Laboratory evaluation including BMP, troponin, and CBC did not show any evidence of acute abnormalities.  Patient's noncontrast CT of the head does show an area of low-attenuation in the right posterior parietal lobe concerning for subacute infarction. Given timing of patient's symptoms including a recurrence of patient's right facial droop just prior to arrival, I spoke to the on-call neurologist, Dr. Quinn Axe who recommended an MRI with and without contrast of the head.  Care of this patient will be signed out to the oncoming physician at the end of my shift.  All pertinent patient information conveyed and  all questions answered.  All further care and disposition decisions will be made by the oncoming physician.   FINAL CLINICAL IMPRESSION(S) / ED DIAGNOSES   Final diagnoses:  Lightheadedness  Facial droop   Rx / DC Orders   ED Discharge Orders     None      Note:  This document was prepared using Dragon voice recognition software and may include unintentional dictation errors.   Naaman Plummer, MD 12/26/21 [phone]

## 2021-12-26 NOTE — Discharge Instructions (Signed)
Your MRI did not show any acute stroke or evidence of demyelinating disease such as multiple sclerosis.  We are not exactly sure what is causing your symptoms.  Please follow-up with neurology as listed above.

## 2022-01-09 ENCOUNTER — Other Ambulatory Visit: Payer: Self-pay | Admitting: Internal Medicine

## 2022-01-09 DIAGNOSIS — R42 Dizziness and giddiness: Secondary | ICD-10-CM

## 2022-01-11 ENCOUNTER — Ambulatory Visit (INDEPENDENT_AMBULATORY_CARE_PROVIDER_SITE_OTHER): Payer: BC Managed Care – PPO | Admitting: Family Medicine

## 2022-01-11 ENCOUNTER — Encounter: Payer: Self-pay | Admitting: Family Medicine

## 2022-01-11 VITALS — BP 123/85 | HR 81 | Wt 191.0 lb

## 2022-01-11 DIAGNOSIS — L723 Sebaceous cyst: Secondary | ICD-10-CM

## 2022-01-11 DIAGNOSIS — R42 Dizziness and giddiness: Secondary | ICD-10-CM | POA: Diagnosis not present

## 2022-01-11 DIAGNOSIS — Z1231 Encounter for screening mammogram for malignant neoplasm of breast: Secondary | ICD-10-CM | POA: Diagnosis not present

## 2022-01-11 NOTE — Assessment & Plan Note (Signed)
To see ENT and discussed non-medicine ways to fix vertigo

## 2022-01-11 NOTE — Progress Notes (Signed)
RGYN patient is here for a problem visit today.   CC: Bump under arm for yrs. No pain pt concerned. M.Aunt recently Dx with Breast Cancer.  Pt also having pelvic pain. Pt states she had a stroke 2 wks ago states it was on the CT scan on 12/26/21  sx's were slurred speech and dizziness.

## 2022-01-11 NOTE — Progress Notes (Signed)
   Subjective:    Patient ID: [name] is a 46 y.o. female presenting with No chief complaint on file.  on 01/11/2022  HPI: Here for multiple complaints: Thinks she had a stroke, has dizziness and some eye rolling--has a negative MRI and has seen neuro Has something under her arm x many years, thought to be a cyst, but has gotten bigger. Under a lot of stress. Has some bilateral lower abdominal pain that comes and goes and is possibly cyclical.  Review of Systems  Constitutional:  Negative for chills and fever.  Respiratory:  Negative for shortness of breath.   Cardiovascular:  Negative for chest pain.  Gastrointestinal:  Positive for abdominal pain. Negative for nausea and vomiting.  Genitourinary:  Negative for dysuria.  Skin:  Positive for rash.      Objective:    BP 123/85   Pulse 81   Wt 191 lb (86.6 kg)   BMI 31.78 kg/m  Physical Exam Exam conducted with a chaperone present.  Constitutional:      General: She is not in acute distress.    Appearance: She is well-developed.  HENT:     Head: Normocephalic and atraumatic.  Eyes:     General: No scleral icterus. Cardiovascular:     Rate and Rhythm: Normal rate.  Pulmonary:     Effort: Pulmonary effort is normal.  Chest:  Breasts:    Right: Normal. No inverted nipple or mass.     Left: Normal. No inverted nipple or mass.  Abdominal:     Palpations: Abdomen is soft. There is no mass.     Tenderness: There is no abdominal tenderness.  Musculoskeletal:     Cervical back: Neck supple.  Skin:    General: Skin is warm and dry.     Comments: 3.5 x 1.5 cm sebaceous cyst  Neurological:     Mental Status: She is alert and oriented to person, place, and time.         Assessment & Plan:   Problem List Items Addressed This Visit       Unprioritized   Vertigo    To see ENT and discussed non-medicine ways to fix vertigo      Sebaceous cyst of axilla   Relevant Orders   Ambulatory referral to  Dermatology   Other Visit Diagnoses     Encounter for screening mammogram for malignant neoplasm of breast    -  Primary   needs screening mammogram   Relevant Orders   MM 3D SCREEN BREAST BILATERAL       Return in about 1 year (around 01/12/2023).  Donnamae Jude, MD 01/11/2022 1:12 PM

## 2022-01-13 ENCOUNTER — Ambulatory Visit
Admission: RE | Admit: 2022-01-13 | Discharge: 2022-01-13 | Disposition: A | Discharge status: Home / Self Care | Payer: BC Managed Care – PPO | Source: Ambulatory Visit | Attending: Internal Medicine | Admitting: Internal Medicine

## 2022-01-13 DIAGNOSIS — R42 Dizziness and giddiness: Secondary | ICD-10-CM | POA: Insufficient documentation

## 2022-01-23 ENCOUNTER — Encounter: Payer: Self-pay | Admitting: Family Medicine

## 2022-01-23 ENCOUNTER — Other Ambulatory Visit: Payer: Self-pay

## 2022-01-23 DIAGNOSIS — D369 Benign neoplasm, unspecified site: Secondary | ICD-10-CM

## 2022-01-23 NOTE — Progress Notes (Signed)
Error

## 2022-01-24 ENCOUNTER — Other Ambulatory Visit: Payer: Self-pay | Admitting: Internal Medicine

## 2022-01-24 DIAGNOSIS — E049 Nontoxic goiter, unspecified: Secondary | ICD-10-CM

## 2022-01-24 DIAGNOSIS — E041 Nontoxic single thyroid nodule: Secondary | ICD-10-CM

## 2022-01-31 ENCOUNTER — Ambulatory Visit
Admission: RE | Admit: 2022-01-31 | Discharge: 2022-01-31 | Disposition: A | Discharge status: Home / Self Care | Payer: BC Managed Care – PPO | Source: Ambulatory Visit | Attending: Internal Medicine | Admitting: Internal Medicine

## 2022-01-31 DIAGNOSIS — E049 Nontoxic goiter, unspecified: Secondary | ICD-10-CM | POA: Diagnosis present

## 2022-01-31 DIAGNOSIS — E041 Nontoxic single thyroid nodule: Secondary | ICD-10-CM | POA: Insufficient documentation

## 2022-02-15 ENCOUNTER — Other Ambulatory Visit: Payer: Self-pay | Admitting: Family Medicine

## 2022-02-15 ENCOUNTER — Ambulatory Visit
Admission: RE | Admit: 2022-02-15 | Discharge: 2022-02-15 | Disposition: A | Discharge status: Home / Self Care | Payer: BC Managed Care – PPO | Source: Ambulatory Visit | Attending: Family Medicine | Admitting: Family Medicine

## 2022-02-15 DIAGNOSIS — R928 Other abnormal and inconclusive findings on diagnostic imaging of breast: Secondary | ICD-10-CM

## 2022-02-15 DIAGNOSIS — N63 Unspecified lump in unspecified breast: Secondary | ICD-10-CM

## 2022-02-15 DIAGNOSIS — Z1231 Encounter for screening mammogram for malignant neoplasm of breast: Secondary | ICD-10-CM

## 2022-02-20 ENCOUNTER — Other Ambulatory Visit: Payer: Self-pay | Admitting: Family Medicine

## 2022-02-20 DIAGNOSIS — N63 Unspecified lump in unspecified breast: Secondary | ICD-10-CM

## 2022-02-20 DIAGNOSIS — R928 Other abnormal and inconclusive findings on diagnostic imaging of breast: Secondary | ICD-10-CM

## 2022-02-21 ENCOUNTER — Other Ambulatory Visit: Payer: Self-pay | Admitting: Family Medicine

## 2022-02-21 DIAGNOSIS — Z1231 Encounter for screening mammogram for malignant neoplasm of breast: Secondary | ICD-10-CM

## 2022-07-27 ENCOUNTER — Ambulatory Visit: Payer: BC Managed Care – PPO | Admitting: Dermatology

## 2022-08-10 ENCOUNTER — Ambulatory Visit (INDEPENDENT_AMBULATORY_CARE_PROVIDER_SITE_OTHER): Payer: BC Managed Care – PPO | Admitting: Dermatology

## 2022-08-10 VITALS — BP 125/78 | HR 78

## 2022-08-10 DIAGNOSIS — Z7189 Other specified counseling: Secondary | ICD-10-CM

## 2022-08-10 DIAGNOSIS — L720 Epidermal cyst: Secondary | ICD-10-CM

## 2022-08-10 NOTE — Progress Notes (Signed)
   New Patient Visit  Subjective  [name] is a 47 y.o. female who presents for the following: New Patient (Initial Visit) (Cyst under left arm, 6 years , doesn't hurt or bother, /Patient had breast implants surgery clear for it , had U/S /).  The following portions of the chart were reviewed this encounter and updated as appropriate:   Tobacco  Allergies  Meds  Problems  Med Hx  Surg Hx  Fam Hx     Review of Systems:  No other skin or systemic complaints except as noted in HPI or Assessment and Plan.  Objective  Well appearing patient in no apparent distress; mood and affect are within normal limits.  A focused examination was performed including left axillary area. Relevant physical exam findings are noted in the Assessment and Plan.  Left Axilla 3.2 x 2 cm Subcutaneous nodule.    Assessment & Plan  Epidermoid cyst of skin Left Axilla Cyst with symptoms and/or recent change.  Discussed surgical excision to remove, including resulting scar and possible recurrence.  Patient will schedule for surgery. Pre-op information given.  Patient reports area has been growing and would like removed Schedule for surgery.  IRuthell Rummage, CMA, am acting as scribe for Sarina Ser, MD. Documentation: I have reviewed the above documentation for accuracy and completeness, and I agree with the above.  Sarina Ser, MD

## 2022-08-10 NOTE — Patient Instructions (Addendum)
 Pre-Operative Instructions  You are scheduled for a surgical procedure at [facility]. We recommend you read the following instructions. If you have any questions or concerns, please call the office at [phone].  Shower and wash the entire body with soap and water the day of your surgery paying special attention to cleansing at and around the planned surgery site.  Avoid aspirin or aspirin containing products at least fourteen (14) days prior to your surgical procedure and for at least one week (7 Days) after your surgical procedure. If you take aspirin on a regular basis for heart disease or history of stroke or for any other reason, we may recommend you continue taking aspirin but please notify us if you take this on a regular basis. Aspirin can cause more bleeding to occur during surgery as well as prolonged bleeding and bruising after surgery.   Avoid other nonsteroidal pain medications at least one week prior to surgery and at least one week prior to your surgery. These include medications such as Ibuprofen (Motrin, Advil and Nuprin), Naprosyn, Voltaren, Relafen, etc. If medications are used for therapeutic reasons, please inform us as they can cause increased bleeding or prolonged bleeding during and bruising after surgical procedures.   Please advise us if you are taking any "blood thinner" medications such as Coumadin or Dipyridamole or Plavix or similar medications. These cause increased bleeding and prolonged bleeding during procedures and bruising after surgical procedures. We may have to consider discontinuing these medications briefly prior to and shortly after your surgery if safe to do so.   Please inform us of all medications you are currently taking. All medications that are taken regularly should be taken the day of surgery as you always do. Nevertheless, we need to be informed of what medications you are taking prior to surgery to know whether they will affect the  procedure or cause any complications.   Please inform us of any medication allergies. Also inform us of whether you have allergies to Latex or rubber products or whether you have had any adverse reaction to Lidocaine or Epinephrine.  Please inform us of any prosthetic or artificial body parts such as artificial heart valve, joint replacements, etc., or similar condition that might require preoperative antibiotics.   We recommend avoidance of alcohol at least two weeks prior to surgery and continued avoidance for at least two weeks after surgery.   We recommend discontinuation of tobacco smoking at least two weeks prior to surgery and continued abstinence for at least two weeks after surgery.  Do not plan strenuous exercise, strenuous work or strenuous lifting for approximately four weeks after your surgery.   We request if you are unable to make your scheduled surgical appointment, please call us at least a week in advance or as soon as you are aware of a problem so that we can cancel or reschedule the appointment.   You MAY TAKE TYLENOL (acetaminophen) for pain as it is not a blood thinner.   PLEASE PLAN TO BE IN TOWN FOR TWO WEEKS FOLLOWING SURGERY, THIS IS IMPORTANT SO YOU CAN BE CHECKED FOR DRESSING CHANGES, SUTURE REMOVAL AND TO MONITOR FOR POSSIBLE COMPLICATIONS.     Due to recent changes in healthcare laws, you may see results of your pathology and/or laboratory studies on MyChart before the doctors have had a chance to review them. We understand that in some cases there may be results that are confusing or concerning to you. Please understand that not all results   are received at the same time and often the doctors may need to interpret multiple results in order to provide you with the best plan of care or course of treatment. Therefore, we ask that you please give us 2 business days to thoroughly review all your results before contacting the office for clarification. Should we see a  critical lab result, you will be contacted sooner.   If You Need Anything After Your Visit  If you have any questions or concerns for your doctor, please call our main line at [phone] and press option 4 to reach your doctor's medical assistant. If no one answers, please leave a voicemail as directed and we will return your call as soon as possible. Messages left after 4 pm will be answered the following business day.   You may also send us a message via MyChart. We typically respond to MyChart messages within 1-2 business days.  For prescription refills, please ask your pharmacy to contact our office. Our fax number is [phone].  If you have an urgent issue when the clinic is closed that cannot wait until the next business day, you can page your doctor at the number below.    Please note that while we do our best to be available for urgent issues outside of office hours, we are not available 24/7.   If you have an urgent issue and are unable to reach us, you may choose to seek medical care at your doctor's office, retail clinic, urgent care center, or emergency room.  If you have a medical emergency, please immediately call 911 or go to the emergency department.  Pager Numbers  - Dr. Kowalski: [phone]  - Dr. Moye: [phone]  - Dr. Stewart: [phone]  In the event of inclement weather, please call our main line at [phone] for an update on the status of any delays or closures.  Dermatology Medication Tips: Please keep the boxes that topical medications come in in order to help keep track of the instructions about where and how to use these. Pharmacies typically print the medication instructions only on the boxes and not directly on the medication tubes.   If your medication is too expensive, please contact our office at [phone] option 4 or send us a message through MyChart.   We are unable to tell what your co-pay for medications will be in advance as  this is different depending on your insurance coverage. However, we may be able to find a substitute medication at lower cost or fill out paperwork to get insurance to cover a needed medication.   If a prior authorization is required to get your medication covered by your insurance company, please allow us 1-2 business days to complete this process.  Drug prices often vary depending on where the prescription is filled and some pharmacies may offer cheaper prices.  The website www.goodrx.com contains coupons for medications through different pharmacies. The prices here do not account for what the cost may be with help from insurance (it may be cheaper with your insurance), but the website can give you the price if you did not use any insurance.  - You can print the associated coupon and take it with your prescription to the pharmacy.  - You may also stop by our office during regular business hours and pick up a GoodRx coupon card.  - If you need your prescription sent electronically to a different pharmacy, notify our office through [facility] MyChart or by phone at [phone] option   4.     Si Usted Necesita Algo Despus de Su Visita  Tambin puede enviarnos un mensaje a travs de MyChart. Por lo general respondemos a los mensajes de MyChart en el transcurso de 1 a 2 das hbiles.  Para renovar recetas, por favor pida a su farmacia que se ponga en contacto con nuestra oficina. Nuestro nmero de fax es el [phone].  Si tiene un asunto urgente cuando la clnica est cerrada y que no puede esperar hasta el siguiente da hbil, puede llamar/localizar a su doctor(a) al nmero que aparece a continuacin.   Por favor, tenga en cuenta que aunque hacemos todo lo posible para estar disponibles para asuntos urgentes fuera del horario de oficina, no estamos disponibles las 24 horas del da, los 7 das de la semana.   Si tiene un problema urgente y no puede comunicarse con nosotros, puede optar por  buscar atencin mdica  en el consultorio de su doctor(a), en una clnica privada, en un centro de atencin urgente o en una sala de emergencias.  Si tiene una emergencia mdica, por favor llame inmediatamente al 911 o vaya a la sala de emergencias.  Nmeros de bper  - Dr. Kowalski: [phone]  - Dra. Moye: [phone]  - Dra. Stewart: [phone]  En caso de inclemencias del tiempo, por favor llame a nuestra lnea principal al [phone] para una actualizacin sobre el estado de cualquier retraso o cierre.  Consejos para la medicacin en dermatologa: Por favor, guarde las cajas en las que vienen los medicamentos de uso tpico para ayudarle a seguir las instrucciones sobre dnde y cmo usarlos. Las farmacias generalmente imprimen las instrucciones del medicamento slo en las cajas y no directamente en los tubos del medicamento.   Si su medicamento es muy caro, por favor, pngase en contacto con nuestra oficina llamando al [phone] y presione la opcin 4 o envenos un mensaje a travs de MyChart.   No podemos decirle cul ser su copago por los medicamentos por adelantado ya que esto es diferente dependiendo de la cobertura de su seguro. Sin embargo, es posible que podamos encontrar un medicamento sustituto a menor costo o llenar un formulario para que el seguro cubra el medicamento que se considera necesario.   Si se requiere una autorizacin previa para que su compaa de seguros cubra su medicamento, por favor permtanos de 1 a 2 das hbiles para completar este proceso.  Los precios de los medicamentos varan con frecuencia dependiendo del lugar de dnde se surte la receta y alguna farmacias pueden ofrecer precios ms baratos.  El sitio web www.goodrx.com tiene cupones para medicamentos de diferentes farmacias. Los precios aqu no tienen en cuenta lo que podra costar con la ayuda del seguro (puede ser ms barato con su seguro), pero el sitio web puede darle el precio si no  utiliz ningn seguro.  - Puede imprimir el cupn correspondiente y llevarlo con su receta a la farmacia.  - Tambin puede pasar por nuestra oficina durante el horario de atencin regular y recoger una tarjeta de cupones de GoodRx.  - Si necesita que su receta se enve electrnicamente a una farmacia diferente, informe a nuestra oficina a travs de MyChart de [facility] o por telfono llamando al [phone] y presione la opcin 4.  

## 2022-08-17 ENCOUNTER — Encounter: Payer: Self-pay | Admitting: Dermatology

## 2022-09-09 ENCOUNTER — Encounter: Payer: Self-pay | Admitting: Family Medicine

## 2022-09-12 ENCOUNTER — Ambulatory Visit (INDEPENDENT_AMBULATORY_CARE_PROVIDER_SITE_OTHER): Payer: BC Managed Care – PPO | Admitting: Dermatology

## 2022-09-12 ENCOUNTER — Encounter: Payer: Self-pay | Admitting: Gastroenterology

## 2022-09-12 ENCOUNTER — Encounter: Payer: Self-pay | Admitting: Dermatology

## 2022-09-12 VITALS — BP 155/91

## 2022-09-12 DIAGNOSIS — D485 Neoplasm of uncertain behavior of skin: Secondary | ICD-10-CM

## 2022-09-12 DIAGNOSIS — L720 Epidermal cyst: Secondary | ICD-10-CM

## 2022-09-12 MED ORDER — MUPIROCIN 2 % EX OINT: 1.0000 | TOPICAL_OINTMENT | Freq: Every day | CUTANEOUS | 1 refills | Status: AC

## 2022-09-12 NOTE — Progress Notes (Signed)
   Follow-Up Visit   Subjective  [name] is a 47 y.o. female who presents for the following: Cyst (Vs other of left axilla - excise today).  The following portions of the chart were reviewed this encounter and updated as appropriate:   Tobacco  Allergies  Meds  Problems  Med Hx  Surg Hx  Fam Hx     Review of Systems:  No other skin or systemic complaints except as noted in HPI or Assessment and Plan.  Objective  Well appearing patient in no apparent distress; mood and affect are within normal limits.  A focused examination was performed including left axilla. Relevant physical exam findings are noted in the Assessment and Plan.  Left Axilla Cystic papule 3.2 x 2.2cm   Assessment & Plan  Neoplasm of uncertain behavior of skin Left Axilla  Skin excision  Lesion length (cm):  3.2 Lesion width (cm):  2.2 Margin per side (cm):  0 Total excision diameter (cm):  3.2 Informed consent: discussed and consent obtained   Timeout: patient name, date of birth, surgical site, and procedure verified   Procedure prep:  Patient was prepped and draped in usual sterile fashion Prep type:  Isopropyl alcohol and povidone-iodine Anesthesia: the lesion was anesthetized in a standard fashion   Anesthetic:  1% lidocaine w/ epinephrine 1-100,000 buffered w/ 8.4% NaHCO3 (9cc lido w/ epi, 3cc bupivicaine, Total of 12cc) Instrument used: #15 blade   Hemostasis achieved with: pressure   Hemostasis achieved with comment:  Electrocautery Outcome: patient tolerated procedure well with no complications   Post-procedure details: sterile dressing applied and wound care instructions given   Dressing type: bandage, pressure dressing and bacitracin (mupirocin)    Skin repair Complexity:  Complex Final length (cm):  4.5 Reason for type of repair: reduce tension to allow closure, reduce the risk of dehiscence, infection, and necrosis, reduce subcutaneous dead space and avoid a hematoma, allow  closure of the large defect, preserve normal anatomy, preserve normal anatomical and functional relationships and enhance both functionality and cosmetic results   Undermining: area extensively undermined   Undermining comment:  Undermining defect 3.2cm Subcutaneous layers (deep stitches):  Suture size:  4-0 Suture type: Vicryl (polyglactin 910)   Subcutaneous suture technique: inverted dermal. Fine/surface layer approximation (top stitches):  Suture size:  4-0 Suture type: nylon   Stitches: simple running   Suture removal (days):  7 Hemostasis achieved with: suture and pressure Outcome: patient tolerated procedure well with no complications   Post-procedure details: sterile dressing applied and wound care instructions given   Dressing type: bandage, pressure dressing and bacitracin (mupirocin)    mupirocin ointment (BACTROBAN) 2 % Apply 1 Application topically daily. Qd to excision site  Specimen 1 - Surgical pathology Differential Diagnosis: Cyst vs other  Check Margins: No  Cyst vs other, L axilla excised today Start Mupirocin oint qd to excision site   Return in about 1 week (around 09/19/2022) for suture removal.  I, Othelia Pulling, RMA, am acting as scribe for Sarina Ser, MD . Documentation: I have reviewed the above documentation for accuracy and completeness, and I agree with the above.  Sarina Ser, MD

## 2022-09-12 NOTE — Patient Instructions (Signed)

## 2022-09-13 ENCOUNTER — Telehealth: Payer: Self-pay

## 2022-09-13 NOTE — Telephone Encounter (Signed)
Left pt msg to call if any problems after yesterday's surgery./sh 

## 2022-09-20 ENCOUNTER — Ambulatory Visit: Payer: BC Managed Care – PPO | Admitting: Dermatology

## 2022-09-20 ENCOUNTER — Ambulatory Visit (INDEPENDENT_AMBULATORY_CARE_PROVIDER_SITE_OTHER): Payer: BC Managed Care – PPO | Admitting: Dermatology

## 2022-09-20 DIAGNOSIS — Z4802 Encounter for removal of sutures: Secondary | ICD-10-CM

## 2022-09-20 DIAGNOSIS — L720 Epidermal cyst: Secondary | ICD-10-CM

## 2022-09-20 NOTE — Progress Notes (Signed)
   Follow-Up Visit   Subjective  [name] is a 47 y.o. female who presents for the following: Other (Post op of left axilla - cyst).  The following portions of the chart were reviewed this encounter and updated as appropriate:   Tobacco  Allergies  Meds  Problems  Med Hx  Surg Hx  Fam Hx     Review of Systems:  No other skin or systemic complaints except as noted in HPI or Assessment and Plan.  Objective  Well appearing patient in no apparent distress; mood and affect are within normal limits.  A focused examination was performed including left axilla. Relevant physical exam findings are noted in the Assessment and Plan.  Left Axilla Healing excision site   Assessment & Plan  Epidermal inclusion cyst Left Axilla  Encounter for Removal of Sutures - Incision site at the left axilla is clean, dry and intact - Wound cleansed, sutures removed, wound cleansed and steri strips applied.  - Discussed pathology results showing cyst  - Patient advised to keep steri-strips dry until they fall off. - Scars remodel for a full year. - Once steri-strips fall off, patient can apply over-the-counter silicone scar cream each night to help with scar remodeling if desired. - Patient advised to call with any concerns or if they notice any new or changing lesions.    Return if symptoms worsen or fail to improve.  I, Joanie Coddington, CMA, am acting as scribe for Armida Sans, MD . Documentation: I have reviewed the above documentation for accuracy and completeness, and I agree with the above.  Armida Sans, MD

## 2022-09-20 NOTE — Patient Instructions (Signed)
Due to recent changes in healthcare laws, you may see results of your pathology and/or laboratory studies on MyChart before the doctors have had a chance to review them. We understand that in some cases there may be results that are confusing or concerning to you. Please understand that not all results are received at the same time and often the doctors may need to interpret multiple results in order to provide you with the best plan of care or course of treatment. Therefore, we ask that you please give us 2 business days to thoroughly review all your results before contacting the office for clarification. Should we see a critical lab result, you will be contacted sooner.   If You Need Anything After Your Visit  If you have any questions or concerns for your doctor, please call our main line at [phone] and press option 4 to reach your doctor's medical assistant. If no one answers, please leave a voicemail as directed and we will return your call as soon as possible. Messages left after 4 pm will be answered the following business day.   You may also send us a message via MyChart. We typically respond to MyChart messages within 1-2 business days.  For prescription refills, please ask your pharmacy to contact our office. Our fax number is [phone].  If you have an urgent issue when the clinic is closed that cannot wait until the next business day, you can page your doctor at the number below.    Please note that while we do our best to be available for urgent issues outside of office hours, we are not available 24/7.   If you have an urgent issue and are unable to reach us, you may choose to seek medical care at your doctor's office, retail clinic, urgent care center, or emergency room.  If you have a medical emergency, please immediately call 911 or go to the emergency department.  Pager Numbers  - Dr. Kowalski: [phone]  - Dr. Moye: [phone]  - Dr. Stewart:  [phone]  In the event of inclement weather, please call our main line at [phone] for an update on the status of any delays or closures.  Dermatology Medication Tips: Please keep the boxes that topical medications come in in order to help keep track of the instructions about where and how to use these. Pharmacies typically print the medication instructions only on the boxes and not directly on the medication tubes.   If your medication is too expensive, please contact our office at [phone] option 4 or send us a message through MyChart.   We are unable to tell what your co-pay for medications will be in advance as this is different depending on your insurance coverage. However, we may be able to find a substitute medication at lower cost or fill out paperwork to get insurance to cover a needed medication.   If a prior authorization is required to get your medication covered by your insurance company, please allow us 1-2 business days to complete this process.  Drug prices often vary depending on where the prescription is filled and some pharmacies may offer cheaper prices.  The website www.goodrx.com contains coupons for medications through different pharmacies. The prices here do not account for what the cost may be with help from insurance (it may be cheaper with your insurance), but the website can give you the price if you did not use any insurance.  - You can print the associated coupon and take it with   your prescription to the pharmacy.  - You may also stop by our office during regular business hours and pick up a GoodRx coupon card.  - If you need your prescription sent electronically to a different pharmacy, notify our office through [facility] MyChart or by phone at [phone] option 4.     Si Usted Necesita Algo Despus de Su Visita  Tambin puede enviarnos un mensaje a travs de MyChart. Por lo general respondemos a los mensajes de MyChart en el transcurso de 1 a 2  das hbiles.  Para renovar recetas, por favor pida a su farmacia que se ponga en contacto con nuestra oficina. Nuestro nmero de fax es el [phone].  Si tiene un asunto urgente cuando la clnica est cerrada y que no puede esperar hasta el siguiente da hbil, puede llamar/localizar a su doctor(a) al nmero que aparece a continuacin.   Por favor, tenga en cuenta que aunque hacemos todo lo posible para estar disponibles para asuntos urgentes fuera del horario de oficina, no estamos disponibles las 24 horas del da, los 7 das de la semana.   Si tiene un problema urgente y no puede comunicarse con nosotros, puede optar por buscar atencin mdica  en el consultorio de su doctor(a), en una clnica privada, en un centro de atencin urgente o en una sala de emergencias.  Si tiene una emergencia mdica, por favor llame inmediatamente al 911 o vaya a la sala de emergencias.  Nmeros de bper  - Dr. Kowalski: [phone]  - Dra. Moye: [phone]  - Dra. Stewart: [phone]  En caso de inclemencias del tiempo, por favor llame a nuestra lnea principal al [phone] para una actualizacin sobre el estado de cualquier retraso o cierre.  Consejos para la medicacin en dermatologa: Por favor, guarde las cajas en las que vienen los medicamentos de uso tpico para ayudarle a seguir las instrucciones sobre dnde y cmo usarlos. Las farmacias generalmente imprimen las instrucciones del medicamento slo en las cajas y no directamente en los tubos del medicamento.   Si su medicamento es muy caro, por favor, pngase en contacto con nuestra oficina llamando al [phone] y presione la opcin 4 o envenos un mensaje a travs de MyChart.   No podemos decirle cul ser su copago por los medicamentos por adelantado ya que esto es diferente dependiendo de la cobertura de su seguro. Sin embargo, es posible que podamos encontrar un medicamento sustituto a menor costo o llenar un formulario para que el  seguro cubra el medicamento que se considera necesario.   Si se requiere una autorizacin previa para que su compaa de seguros cubra su medicamento, por favor permtanos de 1 a 2 das hbiles para completar este proceso.  Los precios de los medicamentos varan con frecuencia dependiendo del lugar de dnde se surte la receta y alguna farmacias pueden ofrecer precios ms baratos.  El sitio web www.goodrx.com tiene cupones para medicamentos de diferentes farmacias. Los precios aqu no tienen en cuenta lo que podra costar con la ayuda del seguro (puede ser ms barato con su seguro), pero el sitio web puede darle el precio si no utiliz ningn seguro.  - Puede imprimir el cupn correspondiente y llevarlo con su receta a la farmacia.  - Tambin puede pasar por nuestra oficina durante el horario de atencin regular y recoger una tarjeta de cupones de GoodRx.  - Si necesita que su receta se enve electrnicamente a una farmacia diferente, informe a nuestra oficina a travs de MyChart de [facility]   o por telfono llamando al [phone] y presione la opcin 4.  

## 2022-10-04 ENCOUNTER — Encounter: Payer: Self-pay | Admitting: Dermatology

## 2022-10-06 ENCOUNTER — Ambulatory Visit (AMBULATORY_SURGERY_CENTER): Payer: BC Managed Care – PPO

## 2022-10-06 ENCOUNTER — Encounter: Payer: Self-pay | Admitting: Gastroenterology

## 2022-10-06 VITALS — Ht 65.5 in | Wt 172.0 lb

## 2022-10-06 DIAGNOSIS — Z86010 Personal history of colonic polyps: Secondary | ICD-10-CM

## 2022-10-06 MED ORDER — ONDANSETRON HCL 4 MG PO TABS: 4.0000 mg | ORAL_TABLET | ORAL | 0 refills | Status: AC

## 2022-10-06 MED ORDER — NA SULFATE-K SULFATE-MG SULF 17.5-3.13-1.6 GM/177ML PO SOLN: 1.0000 | Freq: Once | ORAL | 0 refills | Status: AC

## 2022-10-06 NOTE — Progress Notes (Signed)

## 2022-10-19 ENCOUNTER — Encounter: Payer: Self-pay | Admitting: Certified Registered Nurse Anesthetist

## 2022-10-27 ENCOUNTER — Encounter: Payer: Self-pay | Admitting: Gastroenterology

## 2022-10-27 ENCOUNTER — Ambulatory Visit (AMBULATORY_SURGERY_CENTER): Payer: BC Managed Care – PPO | Admitting: Gastroenterology

## 2022-10-27 VITALS — BP 119/74 | HR 77 | Temp 97.8°F | Resp 15 | Ht 65.0 in | Wt 172.0 lb

## 2022-10-27 DIAGNOSIS — Z86010 Personal history of colonic polyps: Secondary | ICD-10-CM

## 2022-10-27 DIAGNOSIS — Z09 Encounter for follow-up examination after completed treatment for conditions other than malignant neoplasm: Secondary | ICD-10-CM | POA: Diagnosis present

## 2022-10-27 DIAGNOSIS — D125 Benign neoplasm of sigmoid colon: Secondary | ICD-10-CM | POA: Diagnosis not present

## 2022-10-27 DIAGNOSIS — K635 Polyp of colon: Secondary | ICD-10-CM

## 2022-10-27 MED ORDER — SODIUM CHLORIDE 0.9 % IV SOLN: 500.0000 mL | Freq: Once | INTRAVENOUS | Status: DC

## 2022-10-27 NOTE — Progress Notes (Signed)
Report given to PACU, vss 

## 2022-10-27 NOTE — Progress Notes (Signed)
Called to room to assist during endoscopic procedure.  Patient ID and intended procedure confirmed with present staff. Received instructions for my participation in the procedure from the performing physician.  

## 2022-10-27 NOTE — Op Note (Signed)
[facility] Patient Name: [name] Procedure Date: 10/27/2022 8:31 AM MRN: [id number] Endoscopist: Napoleon Form , MD, [id number] Age: 47 Referring MD:  Date of Birth: May 31, 1976 Gender: Female Account #: [id number] Procedure:                Colonoscopy Indications:              High risk colon cancer surveillance: Personal                            history of adenoma (10 mm or greater in size), High                            risk colon cancer surveillance: Personal history of                            multiple (3 or more) adenomas Medicines:                Monitored Anesthesia Care Procedure:                Pre-Anesthesia Assessment:                           - Prior to the procedure, a History and Physical                            was performed, and patient medications and                            allergies were reviewed. The patient's tolerance of                            previous anesthesia was also reviewed. The risks                            and benefits of the procedure and the sedation                            options and risks were discussed with the patient.                            All questions were answered, and informed consent                            was obtained. Prior Anticoagulants: The patient has                            taken no anticoagulant or antiplatelet agents. ASA                            Grade Assessment: III - A patient with severe                            systemic disease. After reviewing the risks and  benefits, the patient was deemed in satisfactory                            condition to undergo the procedure.                           After obtaining informed consent, the colonoscope                            was passed under direct vision. Throughout the                            procedure, the patient's blood pressure, pulse, and                            oxygen saturations  were monitored continuously. The                            Olympus PCF-H190DL [id number]) Colonoscope was                            introduced through the anus and advanced to the the                            cecum, identified by appendiceal orifice and                            ileocecal valve. The colonoscopy was performed                            without difficulty. The patient tolerated the                            procedure well. The quality of the bowel                            preparation was good. The ileocecal valve,                            appendiceal orifice, and rectum were photographed. Scope In: 8:46:21 AM Scope Out: 9:06:29 AM Scope Withdrawal Time: 0 hours 16 minutes 53 seconds  Total Procedure Duration: 0 hours 20 minutes 8 seconds  Findings:                 The perianal and digital rectal examinations were                            normal.                           Three sessile polyps were found in the sigmoid                            colon. The polyps were 3 to 11 mm in size. These  polyps were removed with a cold snare. Resection                            and retrieval were complete.                           A 1 mm polyp was found in the sigmoid colon. The                            polyp was sessile. The polyp was removed with a                            cold biopsy forceps. Resection and retrieval were                            complete.                           A few small-mouthed diverticula were found in the                            sigmoid colon.                           Non-bleeding external and internal hemorrhoids were                            found during retroflexion. The hemorrhoids were                            medium-sized. Complications:            No immediate complications. Estimated Blood Loss:     Estimated blood loss was minimal. Estimated blood                            loss was  minimal. Impression:               - Three 3 to 11 mm polyps in the sigmoid colon,                            removed with a cold snare. Resected and retrieved.                           - One 1 mm polyp in the sigmoid colon, removed with                            a cold biopsy forceps. Resected and retrieved. Recommendation:           - Patient has a contact number available for                            emergencies. The signs and symptoms of potential  delayed complications were discussed with the                            patient. Return to normal activities tomorrow.                            Written discharge instructions were provided to the                            patient.                           - Resume previous diet.                           - Continue present medications.                           - Await pathology results.                           - Repeat colonoscopy in 3 - 5 years for                            surveillance based on pathology results. Napoleon Form, MD 10/27/2022 9:10:54 AM This report has been signed electronically.

## 2022-10-27 NOTE — Patient Instructions (Signed)
Impression/Recommendations:  Polyp and diverticulosis handouts given to patient.  Resume previous diet. Continue present medications. Await pathology results.  Repeat colonoscopy in 3-5 years for surveillance based on pathology results.  YOU HAD AN ENDOSCOPIC PROCEDURE TODAY AT THE [facility]:   Refer to the procedure report that was given to you for any specific questions about what was found during the examination.  If the procedure report does not answer your questions, please call your gastroenterologist to clarify.  If you requested that your care partner not be given the details of your procedure findings, then the procedure report has been included in a sealed envelope for you to review at your convenience later.  YOU SHOULD EXPECT: Some feelings of bloating in the abdomen. Passage of more gas than usual.  Walking can help get rid of the air that was put into your GI tract during the procedure and reduce the bloating. If you had a lower endoscopy (such as a colonoscopy or flexible sigmoidoscopy) you may notice spotting of blood in your stool or on the toilet paper. If you underwent a bowel prep for your procedure, you may not have a normal bowel movement for a few days.  Please Note:  You might notice some irritation and congestion in your nose or some drainage.  This is from the oxygen used during your procedure.  There is no need for concern and it should clear up in a day or so.  SYMPTOMS TO REPORT IMMEDIATELY:  Following lower endoscopy (colonoscopy or flexible sigmoidoscopy):  Excessive amounts of blood in the stool  Significant tenderness or worsening of abdominal pains  Swelling of the abdomen that is new, acute  Fever of 100F or higher  For urgent or emergent issues, a gastroenterologist can be reached at any hour by calling [phone]. Do not use MyChart messaging for urgent concerns.    DIET:  We do recommend a small meal at first, but then you may  proceed to your regular diet.  Drink plenty of fluids but you should avoid alcoholic beverages for 24 hours.  ACTIVITY:  You should plan to take it easy for the rest of today and you should NOT DRIVE or use heavy machinery until tomorrow (because of the sedation medicines used during the test).    FOLLOW UP: Our staff will call the number listed on your records the next business day following your procedure.  We will call around 7:15- 8:00 am to check on you and address any questions or concerns that you may have regarding the information given to you following your procedure. If we do not reach you, we will leave a message.     If any biopsies were taken you will be contacted by phone or by letter within the next 1-3 weeks.  Please call us at [phone] if you have not heard about the biopsies in 3 weeks.    SIGNATURES/CONFIDENTIALITY: You and/or your care partner have signed paperwork which will be entered into your electronic medical record.  These signatures attest to the fact that that the information above on your After Visit Summary has been reviewed and is understood.  Full responsibility of the confidentiality of this discharge information lies with you and/or your care-partner.

## 2022-10-27 NOTE — Progress Notes (Signed)
[facility] History and Physical   Primary Care Physician:  Sherrie Mustache, MD   Reason for Procedure:  History of adenomatous colon polyps  Plan:    Surveillance colonoscopy with possible interventions as needed     HPI: [name] is a very pleasant 47 y.o. female here for surveillance colonoscopy. Denies any nausea, vomiting, abdominal pain, melena or bright red blood per rectum  The risks and benefits as well as alternatives of endoscopic procedure(s) have been discussed and reviewed. All questions answered. The patient agrees to proceed.    Past Medical History:  Diagnosis Date   Anemia    GERD (gastroesophageal reflux disease)    Heart murmur    Hyperglycemia    Hyperlipidemia    Kidney stone    Obesity    Thyroid disease    Thyroid mass     Past Surgical History:  Procedure Laterality Date   BREAST ENHANCEMENT SURGERY  05/27/2020   2021   CESAREAN SECTION     x 2   CHOLECYSTECTOMY N/A 12/14/2014   Procedure: LAPAROSCOPIC CHOLECYSTECTOMY;  Surgeon: Manus Rudd, MD;  Location: [facility] OR;  Service: General;  Laterality: N/A;   COLONOSCOPY     ERCP N/A 12/13/2014   Procedure: ENDOSCOPIC RETROGRADE CHOLANGIOPANCREATOGRAPHY (ERCP);  Surgeon: Jeani Hawking, MD;  Location: [facility] ENDOSCOPY;  Service: Endoscopy;  Laterality: N/A;   PARTIAL HYSTERECTOMY     SLEEVE GASTROPLASTY      Prior to Admission medications   Medication Sig Start Date End Date Taking? Authorizing Provider  losartan (COZAAR) 25 MG tablet Take 25 mg by mouth daily. 01/09/22  Yes [provider]  Multiple Vitamin (MULTIVITAMIN) tablet Take 1 tablet by mouth daily.   Yes [provider]  nortriptyline (PAMELOR) 10 MG capsule Take by mouth. 12/29/21  Yes [provider]  omeprazole (PRILOSEC) 40 MG capsule Take 40 mg by mouth daily as needed.   Yes [provider]  ondansetron (ZOFRAN) 4 MG tablet Take 1 tablet (4 mg total) by mouth as directed. Take  one Zofran 4 mg tablet 30-60 minutes before each colonoscopy prep dose 10/06/22  Yes Adysson Revelle, Eleonore Chiquito, MD  BD INTEGRA SYRINGE 25G X 1" 3 ML MISC PLEASE SUPPLY 4 FOR PATIENT TO BRING WITH HER TO B-12 INJECTION APPOINTMENTS Patient not taking: Reported on 10/06/2022 11/24/21   [provider]  Cholecalciferol (VITAMIN D3) 1.25 MG (50000 UT) CAPS Take 1 capsule by mouth once a week. 11/25/21   [provider]  meclizine (ANTIVERT) 25 MG tablet Take by mouth. Patient not taking: Reported on 10/06/2022    [provider]  mupirocin ointment (BACTROBAN) 2 % Apply 1 Application topically daily. Qd to excision site Patient not taking: Reported on 10/06/2022 09/12/22   Deirdre Evener, MD    Current Outpatient Medications  Medication Sig Dispense Refill   losartan (COZAAR) 25 MG tablet Take 25 mg by mouth daily.     Multiple Vitamin (MULTIVITAMIN) tablet Take 1 tablet by mouth daily.     nortriptyline (PAMELOR) 10 MG capsule Take by mouth.     omeprazole (PRILOSEC) 40 MG capsule Take 40 mg by mouth daily as needed.     ondansetron (ZOFRAN) 4 MG tablet Take 1 tablet (4 mg total) by mouth as directed. Take one Zofran 4 mg tablet 30-60 minutes before each colonoscopy prep dose 2 tablet 0   BD INTEGRA SYRINGE 25G X 1" 3 ML MISC PLEASE SUPPLY 4 FOR PATIENT TO BRING WITH HER TO  B-12 INJECTION APPOINTMENTS (Patient not taking: Reported on 10/06/2022)     Cholecalciferol (VITAMIN D3) 1.25 MG (50000 UT) CAPS Take 1 capsule by mouth once a week.     meclizine (ANTIVERT) 25 MG tablet Take by mouth. (Patient not taking: Reported on 10/06/2022)     mupirocin ointment (BACTROBAN) 2 % Apply 1 Application topically daily. Qd to excision site (Patient not taking: Reported on 10/06/2022) 22 g 1   Current Facility-Administered Medications  Medication Dose Route Frequency Provider Last Rate Last Admin   0.9 %  sodium chloride infusion  500 mL Intravenous Once Napoleon Form, MD         Allergies as of 10/27/2022   (No Known Allergies)    Family History  Problem Relation Age of Onset   Hypertension Mother    Hyperlipidemia Mother    Hypertension Father    Hyperlipidemia Father    Breast cancer Maternal Aunt    Heart attack Paternal Aunt 5   Heart attack Paternal Aunt 44   Heart attack Paternal Uncle    Colon cancer Neg Hx    Colon polyps Neg Hx    Esophageal cancer Neg Hx    Stomach cancer Neg Hx    Rectal cancer Neg Hx     Social History   Socioeconomic History   Marital status: Married    Spouse name: Not on file   Number of children: 2   Years of education: Not on file   Highest education level: Not on file  Occupational History   Occupation: Real Estate  Tobacco Use   Smoking status: Never   Smokeless tobacco: Never  Vaping Use   Vaping Use: Never used  Substance and Sexual Activity   Alcohol use: Yes    Comment: occas wine.   Drug use: No   Sexual activity: Not on file  Other Topics Concern   Not on file  Social History Narrative   Not on file   Social Determinants of Health   Financial Resource Strain: Not on file  Food Insecurity: Not on file  Transportation Needs: Not on file  Physical Activity: Not on file  Stress: Not on file  Social Connections: Not on file  Intimate Partner Violence: Not on file    Review of Systems:  All other review of systems negative except as mentioned in the HPI.  Physical Exam: Vital signs in last 24 hours: Blood Pressure 138/89   Pulse 72   Temperature 97.8 F (36.6 C) (Temporal)   Height 5\' 5"  (1.651 m)   Weight 172 lb (78 kg)   Oxygen Saturation 100%   Body Mass Index 28.62 kg/m  General:   Alert, NAD Lungs:  Clear .   Heart:  Regular rate and rhythm Abdomen:  Soft, nontender and nondistended. Neuro/Psych:  Alert and cooperative. Normal mood and affect. A and O x 3  Reviewed labs, radiology imaging, old records and pertinent past GI work up  Patient is appropriate for  planned procedure(s) and anesthesia in an ambulatory setting   K. Scherry Ran , MD [phone]

## 2022-10-27 NOTE — Progress Notes (Signed)
VS completed by AS. ? ?Pt's states no medical or surgical changes since previsit or office visit. ? ?

## 2022-10-30 ENCOUNTER — Telehealth: Payer: Self-pay

## 2022-10-30 NOTE — Telephone Encounter (Signed)
Attempted to reach patient for post-procedure f/u call. No answer. Unable to leave message as voicemail not set up.

## 2022-11-01 ENCOUNTER — Encounter: Payer: Self-pay | Admitting: Gastroenterology

## 2023-01-05 ENCOUNTER — Ambulatory Visit (INDEPENDENT_AMBULATORY_CARE_PROVIDER_SITE_OTHER): Payer: BC Managed Care – PPO | Admitting: Podiatry

## 2023-01-05 DIAGNOSIS — B353 Tinea pedis: Secondary | ICD-10-CM | POA: Diagnosis not present

## 2023-01-05 DIAGNOSIS — B351 Tinea unguium: Secondary | ICD-10-CM | POA: Diagnosis not present

## 2023-01-05 MED ORDER — TERBINAFINE HCL 250 MG PO TABS: 250.0000 mg | ORAL_TABLET | Freq: Every day | ORAL | 2 refills | Status: AC

## 2023-01-05 MED ORDER — CICLOPIROX 8 % EX SOLN: Freq: Every day | CUTANEOUS | 0 refills | Status: AC

## 2023-01-05 MED ORDER — KETOCONAZOLE 2 % EX CREA: 1.0000 | TOPICAL_CREAM | Freq: Every day | CUTANEOUS | 0 refills | Status: AC

## 2023-01-05 NOTE — Progress Notes (Signed)
  Subjective:  Patient ID: [name], female    DOB: 03-Jun-1976,  MRN: [id number]  Chief Complaint  Patient presents with   Nail Problem    Nail fungus to bilateral hallux and 4th toe on right foot. Patient trimmed bilateral hallux nails a couple of days ago. Has never been treated for nail fungus. Not diabetic     47 y.o. female presents with concern for nail fungal infection to the bilateral hallux nail.  She says that they have fallen off in the past and she has not remove them completely but they have grown back with discoloration and dystrophy.  She does have a history of being in the [employer] and thinks that that is where she picked up nail fungal infection.  Also concern for red rash and itching on the bottom of both feet.  Concern for His foot.  Patient has never been on oral antifungal before.  Past Medical History:  Diagnosis Date   Anemia    CVA (cerebral vascular accident) (HCC)    was told in 2023   GERD (gastroesophageal reflux disease)    Heart murmur    Hyperglycemia    Hyperlipidemia    Kidney stone    Obesity    Thyroid disease    Thyroid mass     No Known Allergies  ROS: Negative except as per HPI above  Objective:  General: AAO x3, NAD  Dermatological: Attention to the bilateral hallux nails there is noted to be near complete removal of the nails however the proximal nail is intact.  No pain on palpation.  There is noted to be greenish-bluish discoloration bilaterally hallux nail.  Also with red rash and dry flaking skin present on the plantar aspect of both feet.  Vascular:  Dorsalis Pedis artery and Posterior Tibial artery pedal pulses are 2/4 bilateral.  Capillary fill time < 3 sec to all digits.   Neruologic: Grossly intact via light touch bilateral. Protective threshold intact to all sites bilateral.   Musculoskeletal: No gross boney pedal deformities bilateral. No pain, crepitus, or limitation noted with foot and ankle range of motion  bilateral. Muscular strength 5/5 in all groups tested bilateral.  Gait: Unassisted, Nonantalgic.   No images are attached to the encounter.  Assessment:   1. Onychomycosis   2. Tinea pedis of both feet      Plan:  Patient was evaluated and treated and all questions answered.  #Onychomycosis -Educated on etiology of nail fungus. -Nail sample deferred -eRx for Penlac 8% topical solution applied to the hallux nails daily for 3 to 6 months -eRx for oral terbinafine 250 mg #30 take once daily for 90 days. Educated on risks and benefits of the medication including risk of hepatic toxicity  # Bilateral tinea pedis Discussed the etiology and treatment options for tinea pedis.  Discussed topical and oral treatment.  Recommended topical treatment with 2% ketoconazole cream.  This was sent to the patient's pharmacy.  Also discussed appropriate foot hygiene, use of antifungal spray such as Tinactin in shoes, as well as cleaning her foot surfaces such as showers and bathroom floors with bleach.     Return in about 3 months (around 04/07/2023) for f/u onychomycosis.          Corinna Gab, DPM [facility] / [facility]

## 2023-01-23 ENCOUNTER — Other Ambulatory Visit: Payer: Self-pay | Admitting: Internal Medicine

## 2023-01-23 DIAGNOSIS — R9389 Abnormal findings on diagnostic imaging of other specified body structures: Secondary | ICD-10-CM

## 2023-01-26 ENCOUNTER — Ambulatory Visit
Admission: RE | Admit: 2023-01-26 | Discharge: 2023-01-26 | Disposition: A | Discharge status: Home / Self Care | Payer: BC Managed Care – PPO | Source: Ambulatory Visit | Attending: Internal Medicine | Admitting: Internal Medicine

## 2023-01-26 DIAGNOSIS — R9389 Abnormal findings on diagnostic imaging of other specified body structures: Secondary | ICD-10-CM | POA: Diagnosis not present

## 2023-03-28 ENCOUNTER — Telehealth: Payer: Self-pay | Admitting: Gastroenterology

## 2023-03-28 NOTE — Telephone Encounter (Signed)
Patient called stated she is having a lot of abdominal pain along with gas. Seeking advise.

## 2023-03-29 NOTE — Telephone Encounter (Signed)
Left message to return call 

## 2023-04-06 ENCOUNTER — Ambulatory Visit: Payer: BC Managed Care – PPO | Admitting: Podiatry

## 2023-04-19 ENCOUNTER — Ambulatory Visit (INDEPENDENT_AMBULATORY_CARE_PROVIDER_SITE_OTHER): Payer: BC Managed Care – PPO | Admitting: Podiatry

## 2023-04-19 DIAGNOSIS — Z91199 Patient's noncompliance with other medical treatment and regimen due to unspecified reason: Secondary | ICD-10-CM

## 2023-04-19 NOTE — Progress Notes (Signed)
 Patient absent for apointment

## 2023-08-28 ENCOUNTER — Telehealth: Payer: Self-pay | Admitting: Gastroenterology

## 2023-08-28 NOTE — Telephone Encounter (Signed)
 Spoke with the patient. 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 Patient agrees to appointment 09/04/23 at 10:10 am.

## 2023-08-28 NOTE — Telephone Encounter (Signed)
 Inbound call from patient stating that she was calling to checked on an appointment because she had not got any reminder calls. I advised her that I did not see any appointments scheduled for her. Patient stated that she spoke with the nurse and was given an appointment for March because she is having issues with abdominal soreness to the touch. Patient requested that I send a message to the nurse to discuss and see about appointment. Please advise.

## 2023-09-04 ENCOUNTER — Ambulatory Visit: Admitting: Gastroenterology

## 2023-09-04 ENCOUNTER — Encounter: Payer: Self-pay | Admitting: Gastroenterology

## 2023-09-04 VITALS — BP 130/80 | HR 83 | Ht 65.0 in | Wt 197.0 lb

## 2023-09-04 DIAGNOSIS — L905 Scar conditions and fibrosis of skin: Secondary | ICD-10-CM

## 2023-09-04 DIAGNOSIS — Z860100 Personal history of colon polyps, unspecified: Secondary | ICD-10-CM

## 2023-09-04 DIAGNOSIS — Z903 Acquired absence of stomach [part of]: Secondary | ICD-10-CM

## 2023-09-04 DIAGNOSIS — R1084 Generalized abdominal pain: Secondary | ICD-10-CM

## 2023-09-04 MED ORDER — DICYCLOMINE HCL 10 MG PO CAPS: 10.0000 mg | ORAL_CAPSULE | Freq: Three times a day (TID) | ORAL | 3 refills | Status: DC

## 2023-09-04 MED ORDER — SUFLAVE 178.7 G PO SOLR: 1.0000 | Freq: Once | ORAL | 0 refills | Status: AC

## 2023-09-04 MED ORDER — DICYCLOMINE HCL 10 MG PO CAPS: 10.0000 mg | ORAL_CAPSULE | Freq: Three times a day (TID) | ORAL | 3 refills | Status: AC

## 2023-09-04 NOTE — Patient Instructions (Addendum)
 VISIT SUMMARY:  Today, we discussed your ongoing gastrointestinal symptoms and abdominal discomfort. You have been experiencing these symptoms for the past six months to a year, including an urgency to defecate after eating, mouthwatering, and occasional dizziness. We also reviewed your history of gastric sleeve surgery, liposuction, and tummy tuck, as well as your history of colonic polyps.  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 We will perform an upper endoscopy to check for any ulcers or gastritis. You should take the prescribed antispasmodic medication (dicyclomine) 10mg  up to three times a day as needed and use the natural peppermint oil (IBgard) samples provided. Please follow the dietary modifications by eating small, frequent meals focusing on protein and cooked vegetables while avoiding sugars. Drink at least eight to ten cups of water daily. Lymphatic massages and dry needling are recommended for managing scar tissue and pain.  -POST-SURGICAL SCAR TISSUE: The discomfort and cramping you feel may be due to scar tissue from your previous liposuction and abdominoplasty. Scar tissue can cause pain and restrict bowel movement. We recommend consulting with the surgeon who performed your procedures for further evaluation. Additional liposuction is not advised as it could worsen the scar tissue. Lymphatic massages and dry needling may help manage your symptoms.  -COLONIC POLYPS: Colonic polyps are growths on the lining of your colon that can sometimes develop into cancer. You have a history of polyps and are on a yearly schedule for colonoscopies to monitor for new or recurrent polyps. We will schedule your next colonoscopy to ensure continued surveillance.  INSTRUCTIONS:  Please follow up with the surgeon who performed your liposuction and  abdominoplasty for further evaluation of your scar tissue. Additionally, we will schedule an upper endoscopy to evaluate your gastric sleeve and a colonoscopy to monitor for colonic polyps. Continue taking your prescribed medications and follow the dietary and lifestyle recommendations provided.  You have been scheduled for an endoscopy and colonoscopy. Please follow the written instructions given to you at your visit today.  If you use inhalers (even only as needed), please bring them with you on the day of your procedure.  DO NOT TAKE 7 DAYS PRIOR TO TEST- Trulicity (dulaglutide) Ozempic, Wegovy (semaglutide) Mounjaro (tirzepatide) Bydureon Bcise (exanatide extended release)  DO NOT TAKE 1 DAY PRIOR TO YOUR TEST Rybelsus (semaglutide) Adlyxin (lixisenatide) Victoza (liraglutide) Byetta (exanatide) ___________________________________________________________________________  Bonita Quin will receive your bowel preparation through [facility], which ensures the lowest copay and home delivery, with outreach via text or call from an [phone] number. Please respond promptly to avoid rescheduling of your procedure. If you are interested in alternative options or have any questions regarding your prep, please contact them at [phone] ____________________________________________________________________________  Your Provider Has Sent Your Bowel Prep Regimen To [facility]   [facility] will contact you to verify your information and collect your copay, if applicable. Enjoy the comfort of your home while your prescription is mailed to you, FREE of any shipping charges.   [facility] accepts all major insurance benefits and applies discounts & coupons.  Have additional questions?   Chat: [email or web address] Call: [phone] Email: [email or web address] NCPDP: [id number]  How will [facility] contact you?  With a Welcome phone call,  a Welcome text and a checkout link in text form.  Texts  you receive from [phone] Are NOT Spam.  *To set up delivery, you must complete the checkout process via link or speak  to one of the patient care representatives. If [facility] is unable to reach you, your prescription may be delayed.  To avoid long hold times on the phone, you may also utilize the secure chat feature on the [facility] website to request that they call you back for transaction completion or to expedite your concerns.   Due to recent changes in healthcare laws, you may see the results of your imaging and laboratory studies on MyChart before your provider has had a chance to review them.  We understand that in some cases there may be results that are confusing or concerning to you. Not all laboratory results come back in the same time frame and the provider may be waiting for multiple results in order to interpret others.  Please give [place] 48 hours in order for your provider to thoroughly review all the results before contacting the office for clarification of your results.    I appreciate the  opportunity to care for you  Thank You   Marsa Aris , MD

## 2023-09-04 NOTE — Progress Notes (Signed)
 [name]    [id number]    Nov 02, 1975  Primary Care Physician:Jadali, Doretha Sou, MD  Referring Physician: Sherrie Mustache, MD [address] [place],  [place] [address]   Chief complaint:  Generalized abdominal pain/discomfort  Discussed the use of AI scribe software for clinical note transcription with the patient, who gave verbal consent to proceed.  History of Present Illness   Diana Danyell Hackley is a 48 year old female who presents with gastrointestinal symptoms and abdominal discomfort.  She has been experiencing gastrointestinal symptoms for the past six months to a year, characterized by an urgency to defecate within 30 minutes of eating, mouthwatering, and occasional dizziness. No nausea is present, but she has frequent bowel movements. These symptoms have increased in frequency over time.  She underwent gastric sleeve surgery in 2015, which initially led to significant weight loss with minimal issues. Currently, she struggles to retain food for more than 30 minutes, especially non-sugary foods. Sugars are tolerated but seem to worsen her symptoms. She experiences fatigue and abdominal cramps, described as severe enough to require cessation of movement until she passes.  In 2022, she underwent liposuction and a tummy tuck performed by Dr. Trudee Kuster in [place]. She reports a sensation of discomfort and a 'sort of touch' in the abdominal area, which she attributes to these procedures. She is concerned about worsening discomfort and the potential presence of scar tissue.  She has a history of polyps found during colonoscopies, with the most recent revealing four small polyps. She is scheduled for regular colonoscopies due to this history.  Her current medications include natural peppermint oil (IBgard) and an antispasmodic (dicyclomine) to manage her symptoms.      Colonosocpy 10/27/2022 - Three 3 to 11 mm polyps in the sigmoid colon, removed with a cold snare.  Resected and retrieved. - One 1 mm polyp in the sigmoid colon, removed with a cold biopsy forceps. Resected and retrieved. Surgical [P], colon, sigmoid, polyp (4) - TUBULAR ADENOMA, FRAGMENTS.  Outpatient Encounter Medications as of 09/04/2023  Medication Sig   dicyclomine (BENTYL) 10 MG capsule Take 1 capsule (10 mg total) by mouth 4 (four) times daily -  before meals and at bedtime.   ketoconazole (NIZORAL) 2 % cream Apply 1 Application topically daily.   losartan (COZAAR) 25 MG tablet Take 25 mg by mouth daily.   Multiple Vitamin (MULTIVITAMIN) tablet Take 1 tablet by mouth daily.   [EXPIRED] PEG 3350-KCl-NaCl-NaSulf-MgSul (SUFLAVE) 178.7 g SOLR Take 1 kit by mouth once for 1 dose.   [DISCONTINUED] dicyclomine (BENTYL) 10 MG capsule Take 1 capsule (10 mg total) by mouth in the morning, at noon, and at bedtime.   BD INTEGRA SYRINGE 25G X 1" 3 ML MISC  (Patient not taking: Reported on 09/04/2023)   Cholecalciferol (VITAMIN D3) 1.25 MG (50000 UT) CAPS Take 1 capsule by mouth once a week. (Patient not taking: Reported on 09/04/2023)   ciclopirox (PENLAC) 8 % solution Apply topically at bedtime. Apply over nail and surrounding skin. Apply daily over previous coat. After seven (7) days, may remove with alcohol and continue cycle. (Patient not taking: Reported on 09/04/2023)   meclizine (ANTIVERT) 25 MG tablet Take by mouth. (Patient not taking: Reported on 09/04/2023)   mupirocin ointment (BACTROBAN) 2 % Apply 1 Application topically daily. Qd to excision site (Patient not taking: Reported on 09/04/2023)   nortriptyline (PAMELOR) 10 MG capsule Take by mouth. (Patient not taking: Reported on 09/04/2023)  omeprazole (PRILOSEC) 40 MG capsule Take 40 mg by mouth daily as needed. (Patient not taking: Reported on 09/04/2023)   ondansetron (ZOFRAN) 4 MG tablet Take 1 tablet (4 mg total) by mouth as directed. Take one Zofran 4 mg tablet 30-60 minutes before each colonoscopy prep dose (Patient not taking: Reported  on 09/04/2023)   No facility-administered encounter medications on file as of 09/04/2023.    Allergies as of 09/04/2023   (No Known Allergies)    Past Medical History:  Diagnosis Date   Anemia    CVA (cerebral vascular accident) (HCC)    was told in 2023   GERD (gastroesophageal reflux disease)    Heart murmur    Hyperglycemia    Hyperlipidemia    Kidney stone    Obesity    Thyroid disease    Thyroid mass     Past Surgical History:  Procedure Laterality Date   BREAST ENHANCEMENT SURGERY  05/27/2020   2021   CESAREAN SECTION     x 2   CHOLECYSTECTOMY N/A 12/14/2014   Procedure: LAPAROSCOPIC CHOLECYSTECTOMY;  Surgeon: Manus Rudd, MD;  Location: [facility] OR;  Service: General;  Laterality: N/A;   COLONOSCOPY     ERCP N/A 12/13/2014   Procedure: ENDOSCOPIC RETROGRADE CHOLANGIOPANCREATOGRAPHY (ERCP);  Surgeon: Jeani Hawking, MD;  Location: [facility]COPY;  Service: Endoscopy;  Laterality: N/A;   PARTIAL HYSTERECTOMY     SLEEVE GASTROPLASTY      Family History  Problem Relation Age of Onset   Hypertension Mother    Hyperlipidemia Mother    Hypertension Father    Hyperlipidemia Father    Breast cancer Maternal Aunt    Heart attack Paternal Aunt 67   Heart attack Paternal Aunt 89   Heart attack Paternal Uncle    Colon cancer Neg Hx    Colon polyps Neg Hx    Esophageal cancer Neg Hx    Stomach cancer Neg Hx    Rectal cancer Neg Hx     Social History   Socioeconomic History   Marital status: Married    Spouse name: Not on file   Number of children: 2   Years of education: Not on file   Highest education level: Not on file  Occupational History   Occupation: Real Estate  Tobacco Use   Smoking status: Never   Smokeless tobacco: Never  Vaping Use   Vaping status: Never Used  Substance and Sexual Activity   Alcohol use: Yes    Comment: occas wine.   Drug use: No   Sexual activity: Not on file  Other Topics Concern   Not on file  Social History Narrative   Not  on file   Social Drivers of Health   Financial Resource Strain: Not on file  Food Insecurity: Not on file  Transportation Needs: Not on file  Physical Activity: Not on file  Stress: Not on file  Social Connections: Not on file  Intimate Partner Violence: Not on file      Review of systems: All other review of systems negative except as mentioned in the HPI.   Physical Exam: Vitals:   09/04/23 1001  BP: 130/80  Pulse: 83   Body mass index is 32.78 kg/m. Gen:      No acute distress HEENT:  sclera anicteric CV: s1s2 rrr, no murmur Lungs: B/l clear. Abd:      soft, non-tender; no palpable masses, no distension Ext:    No edema Neuro: alert and oriented x 3 Psych: normal  mood and affect  Data Reviewed:  Reviewed labs, radiology imaging, old records and pertinent past GI work up     Assessment and Plan    Abdominal Pain and Gastrointestinal Symptoms She experiences abdominal pain and gastrointestinal symptoms, including postprandial urgency, nausea, and mouthwatering episodes for the past three to four years. Symptoms suggest dumping syndrome, although she has only undergone a gastric sleeve, not a gastric bypass. Symptoms may be related to scar tissue from previous liposuction and abdominoplasty, causing subcutaneous scarring and discomfort. Scar tissue can restrict bowel movement, causing discomfort and spasms. - Order upper endoscopy to evaluate the gastric sleeve and check for ulcers or gastritis. - Prescribe antispasmodic medication (dicyclomine) to be used up to three times a day as needed. - Provide samples of natural peppermint oil (IBgard) to be used up to three times a day. - Advise dietary modifications: consume small, frequent meals focusing on protein, cooked vegetables, and avoiding sugars. - Encourage intake of at least eight to ten cups of water daily. - Recommend lymphatic massages and dry needling for scar tissue and pain management.  Post-Surgical  Scar Tissue She has scar tissue from liposuction and abdominoplasty, causing pain and cramping. The scar tissue is likely superficial and related to the surgeries. Additional liposuction is discouraged as it could exacerbate scar tissue formation. Alternatives such as lymphatic massages and dry needling were discussed for symptom management. - Advise consultation with the surgeon who performed the liposuction and abdominoplasty for further evaluation and management options. - Recommend against additional liposuction to prevent further scar tissue formation.  Colonic Polyps She has colonic polyps, with four small polyps found during the last colonoscopy. She is on a surveillance schedule for colonoscopy due to the presence of polyps. - Schedule colonoscopy to assess for new or recurrent polyps.        This visit required >30 minutes of patient care (this includes precharting, chart review, review of results, face-to-face time used for counseling as well as treatment plan and follow-up. The patient was provided an opportunity to ask questions and all were answered. The patient agreed with the plan and demonstrated an understanding of the instructions.  Iona Beard , MD    CC: Sherrie Mustache, MD

## 2023-09-10 ENCOUNTER — Encounter: Payer: Self-pay | Admitting: Gastroenterology

## 2023-10-17 ENCOUNTER — Encounter: Payer: Self-pay | Admitting: Gastroenterology

## 2023-10-17 ENCOUNTER — Encounter (HOSPITAL_COMMUNITY): Payer: Self-pay

## 2023-10-22 ENCOUNTER — Other Ambulatory Visit: Payer: Self-pay | Admitting: Podiatry

## 2023-10-22 ENCOUNTER — Telehealth: Payer: Self-pay | Admitting: Gastroenterology

## 2023-10-22 MED ORDER — SUFLAVE 178.7 G PO SOLR: 1.0000 | ORAL | 0 refills | Status: DC

## 2023-10-22 NOTE — Telephone Encounter (Signed)
 Suflave  resent to pharmacy and [place] informed. She said she will go get it when she gets off work.

## 2023-10-22 NOTE — Telephone Encounter (Signed)
 Inbound call from patient, would like to prep medication to be resent to [facility] pharmacy on [address].

## 2023-10-23 ENCOUNTER — Telehealth: Payer: Self-pay | Admitting: Gastroenterology

## 2023-10-23 NOTE — Telephone Encounter (Signed)
 Patient has colonoscopy tomorrow.  She went to pharmacy to pick up prep and she cannot afford it.  She said her BCBS would pay for Suprep but not the one she was prescribed.  Or if we have a sample we could give her.  Please call patient and advise.  Thank you.

## 2023-10-23 NOTE — Telephone Encounter (Signed)
 Called patient back and she is coming to pick up a sample kit of Suflave 

## 2023-10-24 ENCOUNTER — Ambulatory Visit: Admitting: Gastroenterology

## 2023-10-24 ENCOUNTER — Encounter: Payer: Self-pay | Admitting: Gastroenterology

## 2023-10-24 VITALS — BP 133/85 | HR 90 | Temp 98.6°F | Resp 14 | Ht 65.0 in | Wt 197.0 lb

## 2023-10-24 DIAGNOSIS — Z1211 Encounter for screening for malignant neoplasm of colon: Secondary | ICD-10-CM | POA: Diagnosis present

## 2023-10-24 DIAGNOSIS — K648 Other hemorrhoids: Secondary | ICD-10-CM | POA: Diagnosis not present

## 2023-10-24 DIAGNOSIS — K2950 Unspecified chronic gastritis without bleeding: Secondary | ICD-10-CM

## 2023-10-24 DIAGNOSIS — K5730 Diverticulosis of large intestine without perforation or abscess without bleeding: Secondary | ICD-10-CM

## 2023-10-24 DIAGNOSIS — Z860100 Personal history of colon polyps, unspecified: Secondary | ICD-10-CM

## 2023-10-24 DIAGNOSIS — R1084 Generalized abdominal pain: Secondary | ICD-10-CM

## 2023-10-24 DIAGNOSIS — G8929 Other chronic pain: Secondary | ICD-10-CM

## 2023-10-24 DIAGNOSIS — D125 Benign neoplasm of sigmoid colon: Secondary | ICD-10-CM

## 2023-10-24 DIAGNOSIS — K2100 Gastro-esophageal reflux disease with esophagitis, without bleeding: Secondary | ICD-10-CM

## 2023-10-24 DIAGNOSIS — K644 Residual hemorrhoidal skin tags: Secondary | ICD-10-CM

## 2023-10-24 MED ORDER — SODIUM CHLORIDE 0.9 % IV SOLN: 500.0000 mL | Freq: Once | INTRAVENOUS | Status: DC

## 2023-10-24 MED ORDER — PANTOPRAZOLE SODIUM 40 MG PO TBEC
40.0000 mg | DELAYED_RELEASE_TABLET | Freq: Two times a day (BID) | ORAL | 3 refills | Status: AC
Start: 2023-10-24 — End: ?

## 2023-10-24 NOTE — Progress Notes (Signed)
 [facility] History and Physical   Primary Care Physician:  Annelle Kiel, MD   Reason for Procedure:  Abdominal pain, h/o colon polyps  Plan:    EGD and colonoscopy with possible interventions as needed     HPI: [name] is a very pleasant 48 y.o. female here for EGD and colonoscopy for abdominal pain and h/o colon polyps.  Please refer to office visit note 09/04/23 for additional details  The risks and benefits as well as alternatives of endoscopic procedure(s) have been discussed and reviewed. All questions answered. The patient agrees to proceed.    Past Medical History:  Diagnosis Date   Anemia    CVA (cerebral vascular accident) (HCC)    was told in 2023   GERD (gastroesophageal reflux disease)    Heart murmur    Hyperglycemia    Hyperlipidemia    Kidney stone    Obesity    Thyroid  disease    Thyroid  mass     Past Surgical History:  Procedure Laterality Date   BREAST ENHANCEMENT SURGERY  05/27/2020   2021   CESAREAN SECTION     x 2   CHOLECYSTECTOMY N/A 12/14/2014   Procedure: LAPAROSCOPIC CHOLECYSTECTOMY;  Surgeon: Dareen Ebbing, MD;  Location: [facility] OR;  Service: General;  Laterality: N/A;   COLONOSCOPY     ERCP N/A 12/13/2014   Procedure: ENDOSCOPIC RETROGRADE CHOLANGIOPANCREATOGRAPHY (ERCP);  Surgeon: Alvis Jourdain, MD;  Location: [facility] ENDOSCOPY;  Service: Endoscopy;  Laterality: N/A;   PARTIAL HYSTERECTOMY     SLEEVE GASTROPLASTY      Prior to Admission medications   Medication Sig Start Date End Date Taking? Authorizing Provider  losartan (COZAAR) 25 MG tablet Take 25 mg by mouth daily. 01/09/22  Yes [provider]  BD INTEGRA SYRINGE 25G X 1" 3 ML MISC  11/24/21   [provider]  Cholecalciferol (VITAMIN D3) 1.25 MG (50000 UT) CAPS Take 1 capsule by mouth once a week. Patient not taking: Reported on 10/24/2023 11/25/21   [provider]  ciclopirox  (PENLAC ) 8 % solution Apply topically at bedtime. Apply  over nail and surrounding skin. Apply daily over previous coat. After seven (7) days, may remove with alcohol and continue cycle. Patient not taking: Reported on 10/24/2023 01/05/23   Standiford, Karlene Overcast, DPM  dicyclomine  (BENTYL ) 10 MG capsule Take 1 capsule (10 mg total) by mouth 4 (four) times daily -  before meals and at bedtime. Patient not taking: Reported on 10/24/2023 09/04/23   Grantley Savage V, MD  ketoconazole  (NIZORAL ) 2 % cream Apply 1 Application topically daily. Patient not taking: Reported on 10/24/2023 01/05/23   Standiford, Karlene Overcast, DPM  meclizine (ANTIVERT) 25 MG tablet Take by mouth. Patient not taking: Reported on 10/24/2023    [provider]  Multiple Vitamin (MULTIVITAMIN) tablet Take 1 tablet by mouth daily.    [provider]  mupirocin  ointment (BACTROBAN ) 2 % Apply 1 Application topically daily. Qd to excision site Patient not taking: Reported on 10/24/2023 09/12/22   Elta Halter, MD  nortriptyline (PAMELOR) 10 MG capsule Take by mouth. Patient not taking: Reported on 10/24/2023 12/29/21   [provider]  omeprazole (PRILOSEC) 40 MG capsule Take 40 mg by mouth daily as needed. Patient not taking: Reported on 10/24/2023    [provider]  ondansetron  (ZOFRAN ) 4 MG tablet Take 1 tablet (4 mg total) by mouth as directed. Take one Zofran  4 mg tablet 30-60 minutes before each colonoscopy prep dose Patient not  taking: Reported on 10/24/2023 10/06/22   Fawne Hughley V, MD    Current Outpatient Medications  Medication Sig Dispense Refill   losartan (COZAAR) 25 MG tablet Take 25 mg by mouth daily.     BD INTEGRA SYRINGE 25G X 1" 3 ML MISC  (Patient not taking: Reported on 09/04/2023)     Cholecalciferol (VITAMIN D3) 1.25 MG (50000 UT) CAPS Take 1 capsule by mouth once a week. (Patient not taking: Reported on 10/24/2023)     ciclopirox  (PENLAC ) 8 % solution Apply topically at bedtime. Apply over nail and surrounding skin. Apply daily  over previous coat. After seven (7) days, may remove with alcohol and continue cycle. (Patient not taking: Reported on 10/24/2023) 6.6 mL 0   dicyclomine  (BENTYL ) 10 MG capsule Take 1 capsule (10 mg total) by mouth 4 (four) times daily -  before meals and at bedtime. (Patient not taking: Reported on 10/24/2023) 90 capsule 3   ketoconazole  (NIZORAL ) 2 % cream Apply 1 Application topically daily. (Patient not taking: Reported on 10/24/2023) 60 g 0   meclizine (ANTIVERT) 25 MG tablet Take by mouth. (Patient not taking: Reported on 10/24/2023)     Multiple Vitamin (MULTIVITAMIN) tablet Take 1 tablet by mouth daily.     mupirocin  ointment (BACTROBAN ) 2 % Apply 1 Application topically daily. Qd to excision site (Patient not taking: Reported on 10/24/2023) 22 g 1   nortriptyline (PAMELOR) 10 MG capsule Take by mouth. (Patient not taking: Reported on 10/24/2023)     omeprazole (PRILOSEC) 40 MG capsule Take 40 mg by mouth daily as needed. (Patient not taking: Reported on 10/24/2023)     ondansetron  (ZOFRAN ) 4 MG tablet Take 1 tablet (4 mg total) by mouth as directed. Take one Zofran  4 mg tablet 30-60 minutes before each colonoscopy prep dose (Patient not taking: Reported on 10/24/2023) 2 tablet 0   Current Facility-Administered Medications  Medication Dose Route Frequency Provider Last Rate Last Admin   0.9 %  sodium chloride  infusion  500 mL Intravenous Once Diana Pozo V, MD        Allergies as of 10/24/2023   (No Known Allergies)    Family History  Problem Relation Age of Onset   Hypertension Mother    Hyperlipidemia Mother    Hypertension Father    Hyperlipidemia Father    Breast cancer Maternal Aunt    Heart attack Paternal Aunt 24   Heart attack Paternal Aunt 18   Heart attack Paternal Uncle    Colon cancer Neg Hx    Colon polyps Neg Hx    Esophageal cancer Neg Hx    Stomach cancer Neg Hx    Rectal cancer Neg Hx     Social History   Socioeconomic History   Marital status: Married     Spouse name: Not on file   Number of children: 2   Years of education: Not on file   Highest education level: Not on file  Occupational History   Occupation: Real Estate  Tobacco Use   Smoking status: Never   Smokeless tobacco: Never  Vaping Use   Vaping status: Never Used  Substance and Sexual Activity   Alcohol use: Yes    Comment: occas wine.   Drug use: No   Sexual activity: Not on file  Other Topics Concern   Not on file  Social History Narrative   Not on file   Social Drivers of Health   Financial Resource Strain: Not on file  Food Insecurity: Not  on file  Transportation Needs: Not on file  Physical Activity: Not on file  Stress: Not on file  Social Connections: Not on file  Intimate Partner Violence: Not on file    Review of Systems:  All other review of systems negative except as mentioned in the HPI.  Physical Exam: Vital signs in last 24 hours: BP (!) 145/83   Pulse 74   Temp 98.6 F (37 C)   Ht 5\' 5"  (1.651 m)   Wt 197 lb (89.4 kg)   SpO2 100%   BMI 32.78 kg/m  General:   Alert, NAD Lungs:  Clear .   Heart:  Regular rate and rhythm Abdomen:  Soft, nontender and nondistended. Neuro/Psych:  Alert and cooperative. Normal mood and affect. A and O x 3  Reviewed labs, radiology imaging, old records and pertinent past GI work up  Patient is appropriate for planned procedure(s) and anesthesia in an ambulatory setting   [name] , MD [phone]

## 2023-10-24 NOTE — Progress Notes (Signed)
 Pt's states no medical or surgical changes since previsit or office visit.

## 2023-10-24 NOTE — Patient Instructions (Addendum)
 - Resume previous diet.                           - Continue present medications.                           - Await pathology results.                           - Follow an antireflux regimen.                           - Use Protonix (pantoprazole) 40 mg PO BID. Rx for                            90 days with 3 refills                           - Repeat colonoscopy in 5 years for surveillance                            based on pathology results.                           - 1 polyp removed and sent to pathology                           - hemorrhoids and diverticulosis  YOU HAD AN ENDOSCOPIC PROCEDURE TODAY AT THE [facility]:   Refer to the procedure report that was given to you for any specific questions about what was found during the examination.  If the procedure report does not answer your questions, please call your gastroenterologist to clarify.  If you requested that your care partner not be given the details of your procedure findings, then the procedure report has been included in a sealed envelope for you to review at your convenience later.  YOU SHOULD EXPECT: Some feelings of bloating in the abdomen. Passage of more gas than usual.  Walking can help get rid of the air that was put into your GI tract during the procedure and reduce the bloating. If you had a lower endoscopy (such as a colonoscopy or flexible sigmoidoscopy) you may notice spotting of blood in your stool or on the toilet paper. If you underwent a bowel prep for your procedure, you may not have a normal bowel movement for a few days.  Please Note:  You might notice some irritation and congestion in your nose or some drainage.  This is from the oxygen used during your procedure.  There is no need for concern and it should clear up in a day or so.  SYMPTOMS TO REPORT IMMEDIATELY:  Following lower endoscopy (colonoscopy or flexible sigmoidoscopy):  Excessive amounts of blood in the  stool  Significant tenderness or worsening of abdominal pains  Swelling of the abdomen that is new, acute  Fever of 100F or higher  Following upper endoscopy (EGD)  Vomiting of blood or coffee ground material  New chest pain or pain under the shoulder blades  Painful or persistently difficult swallowing  New shortness of breath  Fever of 100F or higher  Black, tarry-looking stools  For urgent or emergent issues, a gastroenterologist can be reached at any hour by calling [phone]. Do not use MyChart messaging for urgent concerns.    DIET:  We do recommend a small meal at first, but then you may proceed to your regular diet.  Drink plenty of fluids but you should avoid alcoholic beverages for 24 hours.  ACTIVITY:  You should plan to take it easy for the rest of today and you should NOT DRIVE or use heavy machinery until tomorrow (because of the sedation medicines used during the test).    FOLLOW UP: Our staff will call the number listed on your records the next business day following your procedure.  We will call around 7:15- 8:00 am to check on you and address any questions or concerns that you may have regarding the information given to you following your procedure. If we do not reach you, we will leave a message.     If any biopsies were taken you will be contacted by phone or by letter within the next 1-3 weeks.  Please call us  at [phone] if you have not heard about the biopsies in 3 weeks.    SIGNATURES/CONFIDENTIALITY: You and/or your care partner have signed paperwork which will be entered into your electronic medical record.  These signatures attest to the fact that that the information above on your After Visit Summary has been reviewed and is understood.  Full responsibility of the confidentiality of this discharge information lies with you and/or your care-partner.

## 2023-10-24 NOTE — Progress Notes (Signed)
 Sedate, gd SR, tolerated procedure well, VSS, report to RN

## 2023-10-24 NOTE — Progress Notes (Signed)
 Called to room to assist during endoscopic procedure.  Patient ID and intended procedure confirmed with present staff. Received instructions for my participation in the procedure from the performing physician.

## 2023-10-24 NOTE — Op Note (Signed)
 [facility] Patient Name: [name] Procedure Date: 10/24/2023 8:53 AM MRN: [id number] Endoscopist: Sergio Dandy , MD, [id number] Age: 48 Referring MD:  Date of Birth: 10-Nov-1975 Gender: Female Account #: [id number] Procedure:                Colonoscopy Indications:              High risk colon cancer surveillance: Personal                            history of colonic polyps, High risk colon cancer                            surveillance: Personal history of adenoma (10 mm or                            greater in size), High risk colon cancer                            surveillance: Personal history of multiple (3 or                            more) adenomas Medicines:                Monitored Anesthesia Care Procedure:                Pre-Anesthesia Assessment:                           - Prior to the procedure, a History and Physical                            was performed, and patient medications and                            allergies were reviewed. The patient's tolerance of                            previous anesthesia was also reviewed. The risks                            and benefits of the procedure and the sedation                            options and risks were discussed with the patient.                            All questions were answered, and informed consent                            was obtained. Prior Anticoagulants: The patient has                            taken no anticoagulant or antiplatelet agents. ASA  Grade Assessment: III - A patient with severe                            systemic disease. After reviewing the risks and                            benefits, the patient was deemed in satisfactory                            condition to undergo the procedure.                           After obtaining informed consent, the colonoscope                            was passed under direct vision. Throughout the                             procedure, the patient's blood pressure, pulse, and                            oxygen saturations were monitored continuously. The                            Olympus Scope [id number] was introduced through the                            anus and advanced to the the terminal ileum, with                            identification of the appendiceal orifice and IC                            valve. The colonoscopy was performed without                            difficulty. The patient tolerated the procedure                            well. The quality of the bowel preparation was                            adequate. The ileocecal valve, appendiceal orifice,                            and rectum were photographed. Scope In: 9:10:18 AM Scope Out: 9:21:25 AM Scope Withdrawal Time: 0 hours 8 minutes 2 seconds  Total Procedure Duration: 0 hours 11 minutes 7 seconds  Findings:                 The perianal and digital rectal examinations were                            normal.  A 3 mm polyp was found in the sigmoid colon. The                            polyp was sessile. The polyp was removed with a                            cold snare. Resection and retrieval were complete.                           Scattered small-mouthed diverticula were found in                            the sigmoid colon, descending colon, transverse                            colon and ascending colon.                           Non-bleeding external and internal hemorrhoids were                            found during retroflexion. The hemorrhoids were                            medium-sized. Complications:            No immediate complications. Estimated Blood Loss:     Estimated blood loss was minimal. Impression:               - One 3 mm polyp in the sigmoid colon, removed with                            a cold snare. Resected and retrieved.                            - Diverticulosis in the sigmoid colon, in the                            descending colon, in the transverse colon and in                            the ascending colon.                           - Non-bleeding external and internal hemorrhoids. Recommendation:           - Patient has a contact number available for                            emergencies. The signs and symptoms of potential                            delayed complications were discussed with the  patient. Return to normal activities tomorrow.                            Written discharge instructions were provided to the                            patient.                           - Resume previous diet.                           - Continue present medications.                           - Await pathology results.                           - Repeat colonoscopy in 5 years for surveillance                            based on pathology results. Leafy Motsinger V. Mcarthur Ivins, MD 10/24/2023 9:26:37 AM This report has been signed electronically.

## 2023-10-24 NOTE — Op Note (Addendum)
 [facility] Patient Name: [name] Procedure Date: 10/24/2023 8:53 AM MRN: [id number] Endoscopist: Sergio Dandy , MD, [id number] Age: 48 Referring MD:  Date of Birth: Jun 10, 1976 Gender: Female Account #: [id number] Procedure:                Upper GI endoscopy Indications:              Epigastric abdominal pain, Generalized abdominal                            pain Medicines:                Monitored Anesthesia Care Procedure:                Pre-Anesthesia Assessment:                           - Prior to the procedure, a History and Physical                            was performed, and patient medications and                            allergies were reviewed. The patient's tolerance of                            previous anesthesia was also reviewed. The risks                            and benefits of the procedure and the sedation                            options and risks were discussed with the patient.                            All questions were answered, and informed consent                            was obtained. Prior Anticoagulants: The patient has                            taken no anticoagulant or antiplatelet agents. ASA                            Grade Assessment: III - A patient with severe                            systemic disease. After reviewing the risks and                            benefits, the patient was deemed in satisfactory                            condition to undergo the procedure.  After obtaining informed consent, the endoscope was                            passed under direct vision. Throughout the                            procedure, the patient's blood pressure, pulse, and                            oxygen saturations were monitored continuously. The                            Olympus Scope [id number] was introduced through the                            mouth, and advanced to the second part  of duodenum.                            The upper GI endoscopy was accomplished without                            difficulty. The patient tolerated the procedure                            well. Scope In: Scope Out: Findings:                 LA Grade C (one or more mucosal breaks continuous                            between tops of 2 or more mucosal folds, less than                            75% circumference) esophagitis with no bleeding was                            found 36 to 38 cm from the incisors. Biopsies were                            taken with a cold forceps for histology.                           Evidence of a sleeve gastrectomy was found in the                            gastric body. This was characterized by healthy                            appearing mucosa.                           The cardia and gastric fundus were normal on  retroflexion.                           The examined duodenum was normal. Complications:            No immediate complications. Estimated Blood Loss:     Estimated blood loss was minimal. Impression:               - LA Grade C erosive esophagitis with no bleeding.                            Biopsied.                           - A sleeve gastrectomy was found, characterized by                            healthy appearing mucosa.                           - Normal examined duodenum. Recommendation:           - Resume previous diet.                           - Continue present medications.                           - Await pathology results.                           - Follow an antireflux regimen.                           - Use Protonix (pantoprazole) 40 mg PO BID. Rx for                            90 days with 3 refills                           - Follow up in GI office in 3 months Sergio Dandy, MD 10/24/2023 9:30:02 AM This report has been signed electronically.

## 2023-10-25 ENCOUNTER — Telehealth: Payer: Self-pay | Admitting: Lactation Services

## 2023-10-25 NOTE — Telephone Encounter (Signed)
 No answer on follow up call; unable to leave voicemail

## 2023-10-29 LAB — SURGICAL PATHOLOGY

## 2023-11-28 ENCOUNTER — Ambulatory Visit: Payer: Self-pay | Admitting: Gastroenterology

## 2024-02-06 ENCOUNTER — Ambulatory Visit (INDEPENDENT_AMBULATORY_CARE_PROVIDER_SITE_OTHER): Admitting: Podiatry

## 2024-02-06 DIAGNOSIS — Z91199 Patient's noncompliance with other medical treatment and regimen due to unspecified reason: Secondary | ICD-10-CM

## 2024-02-06 NOTE — Progress Notes (Signed)
 Cancel 24 hours

## 2024-02-25 ENCOUNTER — Ambulatory Visit (INDEPENDENT_AMBULATORY_CARE_PROVIDER_SITE_OTHER): Admitting: Podiatry

## 2024-02-25 DIAGNOSIS — Z91199 Patient's noncompliance with other medical treatment and regimen due to unspecified reason: Secondary | ICD-10-CM

## 2024-02-25 NOTE — Progress Notes (Signed)
 Cancel -COVID

## 2024-03-25 ENCOUNTER — Encounter: Payer: Self-pay | Admitting: Gastroenterology

## 2024-03-26 ENCOUNTER — Other Ambulatory Visit: Payer: Self-pay | Admitting: Internal Medicine

## 2024-03-26 DIAGNOSIS — Z1231 Encounter for screening mammogram for malignant neoplasm of breast: Secondary | ICD-10-CM

## 2024-03-27 ENCOUNTER — Other Ambulatory Visit: Payer: Self-pay | Admitting: Podiatry

## 2024-03-28 ENCOUNTER — Ambulatory Visit

## 2024-04-18 ENCOUNTER — Ambulatory Visit
Admission: RE | Admit: 2024-04-18 | Discharge: 2024-04-18 | Disposition: A | Discharge status: Home / Self Care | Source: Ambulatory Visit | Attending: Internal Medicine | Admitting: Internal Medicine

## 2024-04-18 ENCOUNTER — Other Ambulatory Visit: Payer: Self-pay | Admitting: Internal Medicine

## 2024-04-18 DIAGNOSIS — Z1231 Encounter for screening mammogram for malignant neoplasm of breast: Secondary | ICD-10-CM

## 2024-04-24 ENCOUNTER — Other Ambulatory Visit: Payer: Self-pay

## 2024-04-24 ENCOUNTER — Emergency Department
Admission: EM | Admit: 2024-04-24 | Discharge: 2024-04-24 | Disposition: A | Discharge status: Home / Self Care | Source: Ambulatory Visit | Attending: Emergency Medicine | Admitting: Emergency Medicine

## 2024-04-24 DIAGNOSIS — R202 Paresthesia of skin: Secondary | ICD-10-CM | POA: Insufficient documentation

## 2024-04-24 DIAGNOSIS — R531 Weakness: Secondary | ICD-10-CM | POA: Diagnosis not present

## 2024-04-24 DIAGNOSIS — R112 Nausea with vomiting, unspecified: Secondary | ICD-10-CM | POA: Insufficient documentation

## 2024-04-24 DIAGNOSIS — R197 Diarrhea, unspecified: Secondary | ICD-10-CM | POA: Insufficient documentation

## 2024-04-24 LAB — CBC
HCT: 39.7 % (ref 36.0–46.0)
Hemoglobin: 12.7 g/dL (ref 12.0–15.0)
MCH: 26.5 pg (ref 26.0–34.0)
MCHC: 32 g/dL (ref 30.0–36.0)
MCV: 82.7 fL (ref 80.0–100.0)
Platelets: 259 K/uL (ref 150–400)
RBC: 4.8 MIL/uL (ref 3.87–5.11)
RDW: 14.3 % (ref 11.5–15.5)
WBC: 7.2 K/uL (ref 4.0–10.5)
nRBC: 0 % (ref 0.0–0.2)

## 2024-04-24 LAB — URINALYSIS, ROUTINE W REFLEX MICROSCOPIC
Bacteria, UA: NONE SEEN
Bilirubin Urine: NEGATIVE
Glucose, UA: NEGATIVE mg/dL
Hgb urine dipstick: NEGATIVE
Ketones, ur: NEGATIVE mg/dL
Leukocytes,Ua: NEGATIVE
Nitrite: NEGATIVE
Protein, ur: 30 mg/dL — AB
Specific Gravity, Urine: 1.031 — ABNORMAL HIGH (ref 1.005–1.030)
pH: 5 (ref 5.0–8.0)

## 2024-04-24 LAB — COMPREHENSIVE METABOLIC PANEL WITH GFR
ALT: 12 U/L (ref 0–44)
AST: 26 U/L (ref 15–41)
Albumin: 4.2 g/dL (ref 3.5–5.0)
Alkaline Phosphatase: 53 U/L (ref 38–126)
Anion gap: 10 (ref 5–15)
BUN: 9 mg/dL (ref 6–20)
CO2: 22 mmol/L (ref 22–32)
Calcium: 8.8 mg/dL — ABNORMAL LOW (ref 8.9–10.3)
Chloride: 105 mmol/L (ref 98–111)
Creatinine, Ser: 1.02 mg/dL — ABNORMAL HIGH (ref 0.44–1.00)
GFR, Estimated: >60 mL/min (ref >60)
Glucose, Bld: 124 mg/dL — ABNORMAL HIGH (ref 70–99)
Potassium: 3.9 mmol/L (ref 3.5–5.1)
Sodium: 138 mmol/L (ref 135–145)
Total Bilirubin: 0.7 mg/dL (ref 0.0–1.2)
Total Protein: 6.7 g/dL (ref 6.5–8.1)

## 2024-04-24 LAB — LIPASE, BLOOD: Lipase: 33 U/L (ref 11–51)

## 2024-04-24 LAB — MAGNESIUM: Magnesium: 2.2 mg/dL (ref 1.7–2.4)

## 2024-04-24 MED ORDER — DICYCLOMINE HCL 10 MG PO CAPS: 10.0000 mg | ORAL_CAPSULE | Freq: Three times a day (TID) | ORAL | 1 refills | Status: AC

## 2024-04-24 MED ORDER — ONDANSETRON HCL 4 MG/2ML IJ SOLN
4.0000 mg | Freq: Once | INTRAMUSCULAR | Status: AC
  Administered 2024-04-24: 4 mg via INTRAVENOUS
  Filled 2024-04-24: qty 2

## 2024-04-24 MED ORDER — ONDANSETRON 4 MG PO TBDP
4.0000 mg | ORAL_TABLET | Freq: Three times a day (TID) | ORAL | 0 refills | Status: AC | PRN
Start: 2024-04-24 — End: ?

## 2024-04-24 MED ORDER — DICYCLOMINE HCL 10 MG PO CAPS
10.0000 mg | ORAL_CAPSULE | Freq: Once | ORAL | Status: AC
  Administered 2024-04-24: 10 mg via ORAL
  Filled 2024-04-24: qty 1

## 2024-04-24 MED ORDER — PANTOPRAZOLE SODIUM 40 MG IV SOLR
40.0000 mg | Freq: Once | INTRAVENOUS | Status: AC
  Administered 2024-04-24: 40 mg via INTRAVENOUS
  Filled 2024-04-24: qty 10

## 2024-04-24 MED ORDER — LACTATED RINGERS IV BOLUS
1000.0000 mL | Freq: Once | INTRAVENOUS | Status: AC
  Administered 2024-04-24: 1000 mL via INTRAVENOUS

## 2024-04-24 NOTE — ED Provider Notes (Signed)
 [facility] Provider Note    Event Date/Time   First MD Initiated Contact with Patient 04/24/24 1102     (approximate)   History   Emesis   HPI  [name] is a 48 y.o. female who presents to the ED for evaluation of Emesis   Review of PCP visit from 2 weeks ago. Review outpatient EGD from 6 months ago.  Erosive esophagitis, previous sleeve gastrectomy, remains on PPI.  Patient presents to the ED for evaluation of about 48 hours of N/V/D without abdominal pain.  No fevers or bloody output.  Reports somewhat improved emesis when taking Zofran  at home.  Reports generalized weakness, paresthesias and tingling to her face and arms.    Physical Exam   Triage Vital Signs: ED Triage Vitals  Encounter Vitals Group     BP 04/24/24 0919 105/83     Girls Systolic BP Percentile --      Girls Diastolic BP Percentile --      Boys Systolic BP Percentile --      Boys Diastolic BP Percentile --      Pulse Rate 04/24/24 0919 85     Resp 04/24/24 0919 18     Temp 04/24/24 0919 98.4 F (36.9 C)     Temp Source 04/24/24 0919 Oral     SpO2 04/24/24 0919 100 %     Weight --      Height 04/24/24 0920 5' 5 (1.651 m)     Head Circumference --      Peak Flow --      Pain Score 04/24/24 0920 0     Pain Loc --      Pain Education --      Exclude from Growth Chart --     Most recent vital signs: Vitals:   04/24/24 0919  BP: 105/83  Pulse: 85  Resp: 18  Temp: 98.4 F (36.9 C)  SpO2: 100%    General: Awake, no distress.  Well-appearing and conversational CV:  Good peripheral perfusion.  Resp:  Normal effort.  Abd:  No distention.  Soft and nontender throughout MSK:  No deformity noted.  Neuro:  No focal deficits appreciated. Cranial nerves II through XII intact 5/5 strength and sensation in all 4 extremities Other:     ED Results / Procedures / Treatments   Labs (all labs ordered are listed, but only abnormal results are displayed) Labs  Reviewed  COMPREHENSIVE METABOLIC PANEL WITH GFR - Abnormal; Notable for the following components:      Result Value   Glucose, Bld 124 (*)    Creatinine, Ser 1.02 (*)    Calcium 8.8 (*)    All other components within normal limits  URINALYSIS, ROUTINE W REFLEX MICROSCOPIC - Abnormal; Notable for the following components:   Color, Urine AMBER (*)    APPearance TURBID (*)    Specific Gravity, Urine 1.031 (*)    Protein, ur 30 (*)    All other components within normal limits  LIPASE, BLOOD  CBC  MAGNESIUM    EKG   RADIOLOGY   Official radiology report(s): No results found.  PROCEDURES and INTERVENTIONS:  Procedures  Medications  lactated ringers  bolus 1,000 mL (1,000 mLs Intravenous New Bag/Given 04/24/24 1224)  ondansetron  (ZOFRAN ) injection 4 mg (4 mg Intravenous Given 04/24/24 1224)  dicyclomine  (BENTYL ) capsule 10 mg (10 mg Oral Given 04/24/24 1225)  pantoprazole  (PROTONIX ) injection 40 mg (40 mg Intravenous Given 04/24/24 1225)  IMPRESSION / MDM / ASSESSMENT AND PLAN / ED COURSE  I reviewed the triage vital signs and the nursing notes.  Differential diagnosis includes, but is not limited to, foodborne illness, viral syndrome, gastroenteritis, SBO, AKI, UTI  {Patient presents with symptoms of an acute illness or injury that is potentially life-threatening.  Patient s/p remote gastric sleeve presents with 1-2 days of N/V/D.  Looks well to me, somewhat dry, but otherwise normal exam.  Nontender abdomen.  No neurologic deficits.  Reassuring vital signs and workup.  Normal CBC and lipase.  Metabolic panel with marginal worsening of her creatinine without significant electrolyte derangements.  No signs of UTI.  Considering her lack of abdominal pain and tenderness, well-appearing status do not feel the need for CT imaging as I doubt more severe surgical pathology.   After some IV fluids and antiemetics she is feeling much better.  Tolerating p.o. and suitable for  outpatient management  Clinical Course as of 04/24/24 1335  Thu Apr 24, 2024  1150 48hrs N/V/D painless [DS]  1334 Reassessed, feeling better and appreciative.  We discussed care at home and ED return precautions.  Answered questions. [DS]    Clinical Course User Index [DS] Claudene Rover, MD     FINAL CLINICAL IMPRESSION(S) / ED DIAGNOSES   Final diagnoses:  Nausea vomiting and diarrhea     Rx / DC Orders   ED Discharge Orders          Ordered    ondansetron  (ZOFRAN -ODT) 4 MG disintegrating tablet  Every 8 hours PRN        04/24/24 1335    dicyclomine  (BENTYL ) 10 MG capsule  3 times daily before meals & bedtime        04/24/24 1335             Note:  This document was prepared using Dragon voice recognition software and may include unintentional dictation errors.   Claudene Rover, MD 04/24/24 1336

## 2024-04-24 NOTE — ED Notes (Signed)
 Pt discharged to home, instructions and medications reviewed.  Pt verbalized understanding, no questions at this time.

## 2024-04-24 NOTE — ED Triage Notes (Signed)
 Patient states she had nausea and vomiting since Tuesday; yesterday started having some numbness to her right hand and feels like the muscles in her right eye aren't working. No facial droop noted, patient ambulatory in triage.
